# Patient Record
Sex: Male | Born: 1950 | Race: White | Hispanic: No | Marital: Single | Smoking: Former smoker
Health system: Southern US, Community
[De-identification: ages and names within clinical notes are randomized; demographics above are authoritative.]

---

## 2004-03-18 ENCOUNTER — Emergency Department: Payer: Self-pay | Admitting: Emergency Medicine

## 2005-01-24 ENCOUNTER — Emergency Department: Payer: Self-pay | Admitting: Emergency Medicine

## 2006-09-02 ENCOUNTER — Emergency Department: Payer: Self-pay | Admitting: Unknown Physician Specialty

## 2006-09-12 ENCOUNTER — Other Ambulatory Visit: Payer: Self-pay

## 2006-09-12 ENCOUNTER — Inpatient Hospital Stay: Payer: Self-pay | Admitting: Internal Medicine

## 2006-10-14 ENCOUNTER — Other Ambulatory Visit: Payer: Self-pay

## 2006-10-14 ENCOUNTER — Inpatient Hospital Stay: Payer: Self-pay | Admitting: Internal Medicine

## 2007-03-04 ENCOUNTER — Ambulatory Visit: Payer: Self-pay | Admitting: Internal Medicine

## 2007-05-28 ENCOUNTER — Emergency Department: Payer: Self-pay | Admitting: Emergency Medicine

## 2007-08-02 ENCOUNTER — Emergency Department: Payer: Self-pay | Admitting: Emergency Medicine

## 2007-08-09 ENCOUNTER — Emergency Department: Payer: Self-pay | Admitting: Emergency Medicine

## 2007-08-14 ENCOUNTER — Emergency Department: Payer: Self-pay | Admitting: Emergency Medicine

## 2007-08-20 ENCOUNTER — Ambulatory Visit: Payer: Self-pay | Admitting: Gastroenterology

## 2007-09-22 ENCOUNTER — Emergency Department: Payer: Self-pay | Admitting: Emergency Medicine

## 2007-10-24 ENCOUNTER — Emergency Department: Payer: Self-pay | Admitting: Emergency Medicine

## 2007-11-04 ENCOUNTER — Emergency Department: Payer: Self-pay | Admitting: Emergency Medicine

## 2007-12-17 ENCOUNTER — Emergency Department: Payer: Self-pay | Admitting: Emergency Medicine

## 2007-12-17 ENCOUNTER — Other Ambulatory Visit: Payer: Self-pay

## 2008-01-31 ENCOUNTER — Emergency Department: Payer: Self-pay

## 2008-04-08 IMAGING — CT CT HEAD WITHOUT CONTRAST
1 series · 16 of 30 positions shown, 20 images · non-contrast
Comparison: none

REASON FOR EXAM: frequent falls RME 2
COMMENTS:

[Series 2: soft tissue · axial · 0.40mm/px · z∈[+450,+590]mm · 16 of 32 slices shown, 20 images]
[im 2/32  brain]
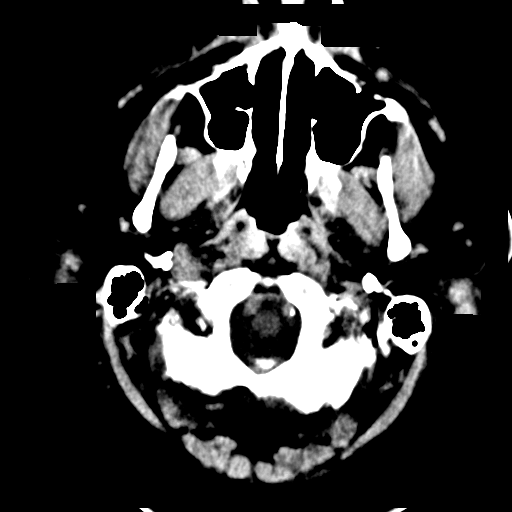
[im 2/32  bone]
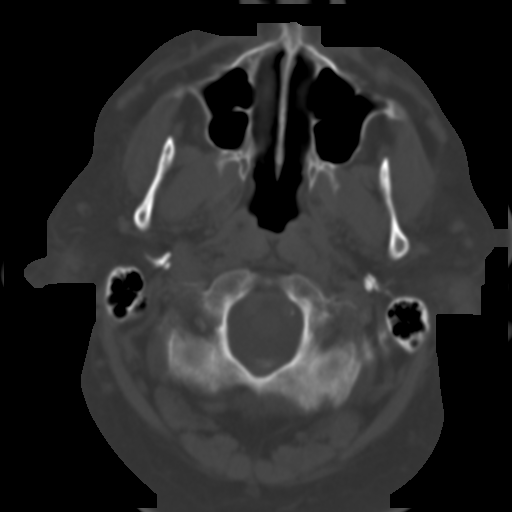
[im 4/32  brain]
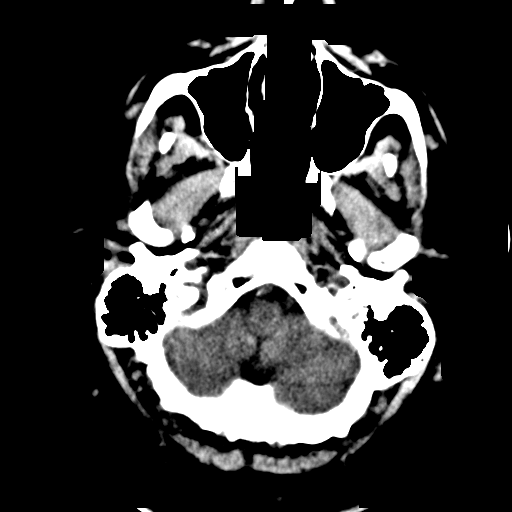
[im 6/32  brain]
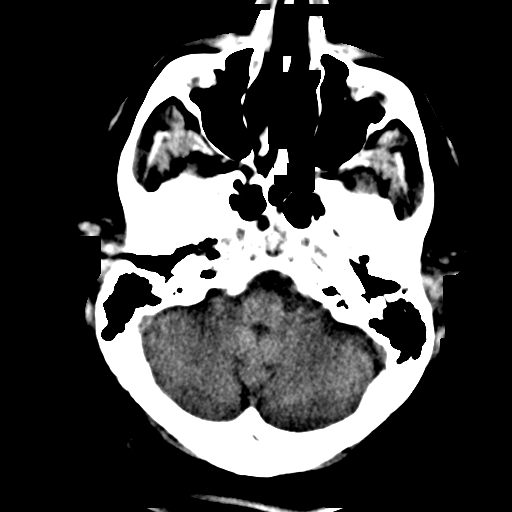
[im 8/32  brain]
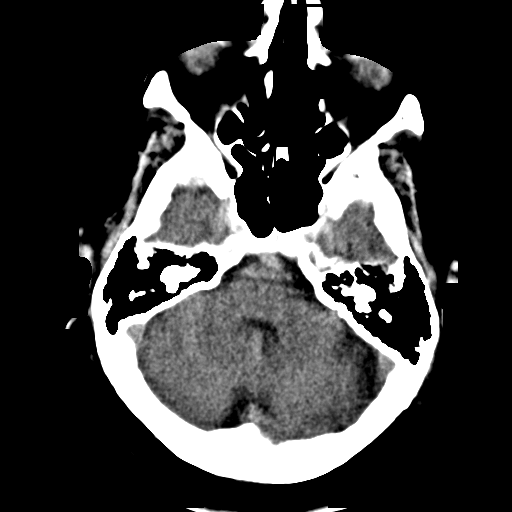
[im 9/32  brain]
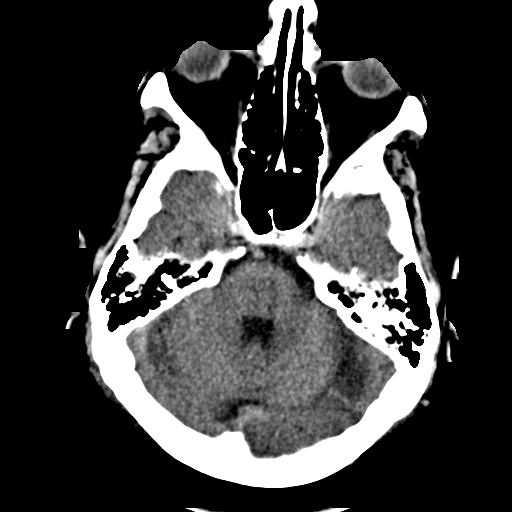
[im 9/32  bone]
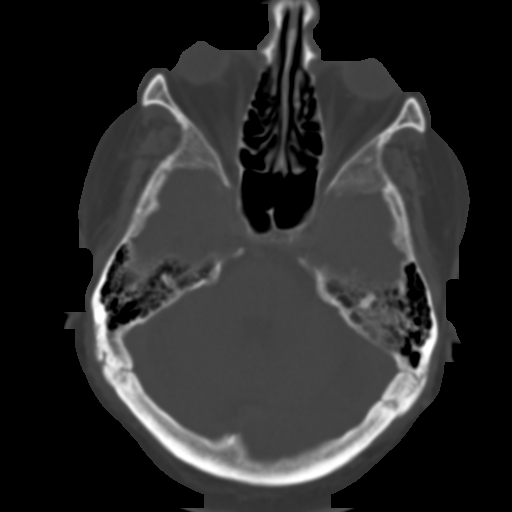
[im 11/32  brain]
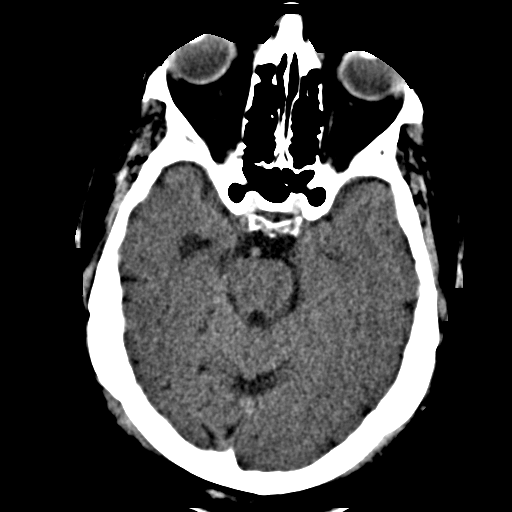
[im 13/32  brain]
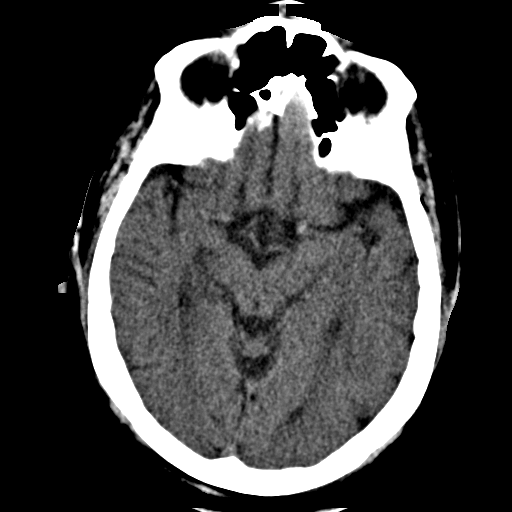
[im 15/32  brain]
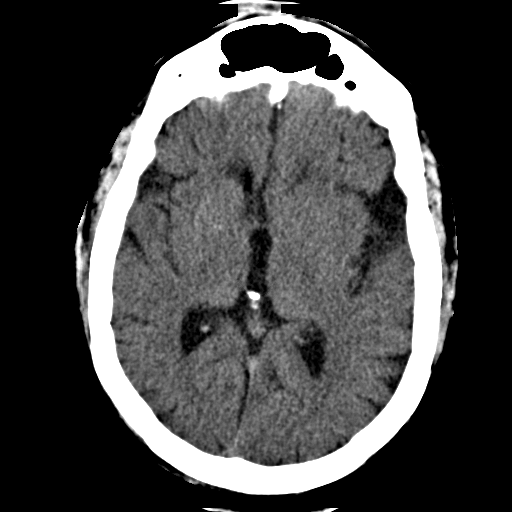
[im 17/32  brain]
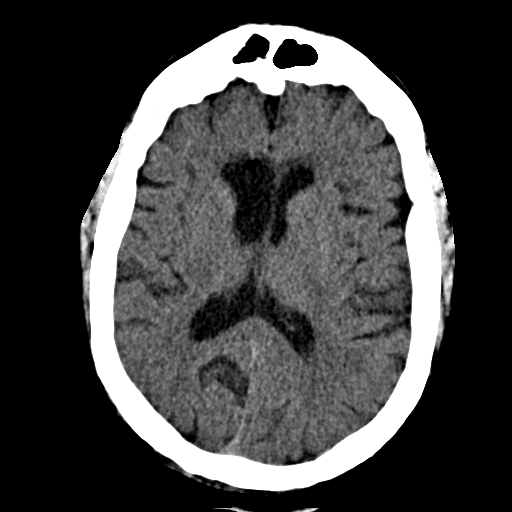
[im 17/32  bone]
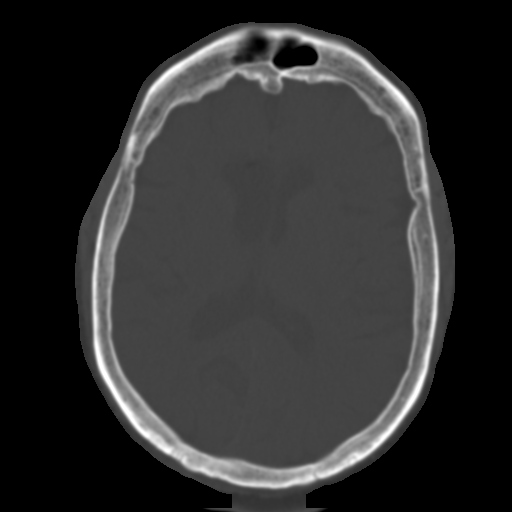
[im 19/32  brain]
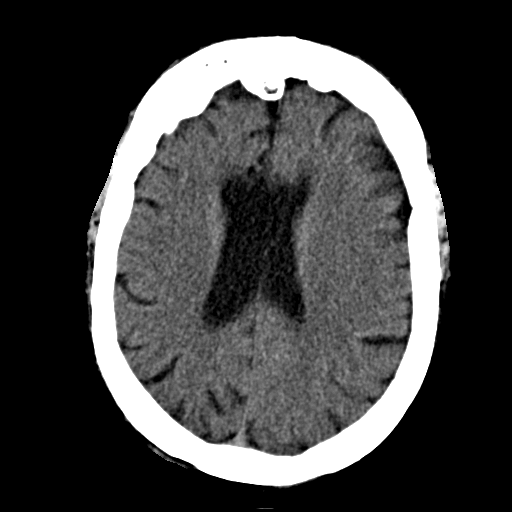
[im 21/32  brain]
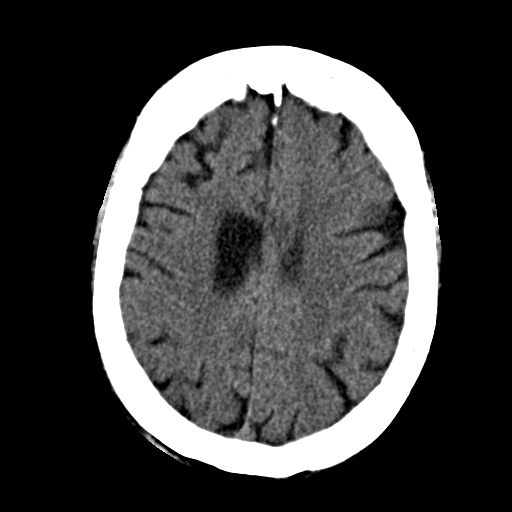
[im 23/32  brain]
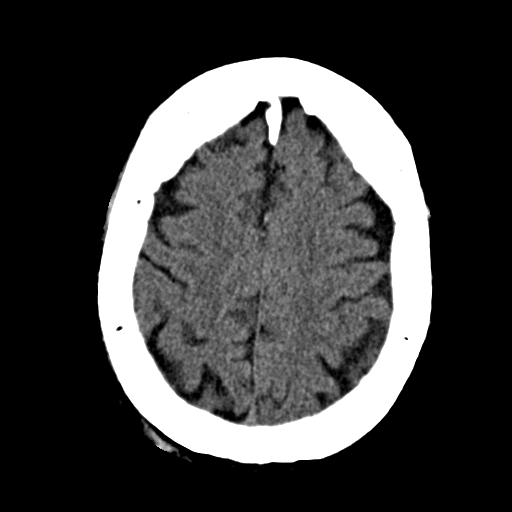
[im 24/32  brain]
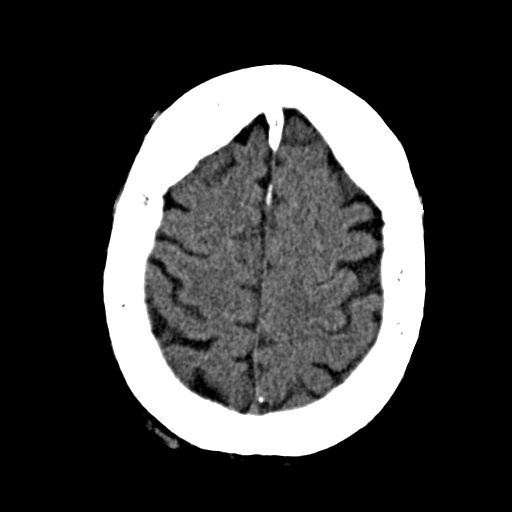
[im 24/32  bone]
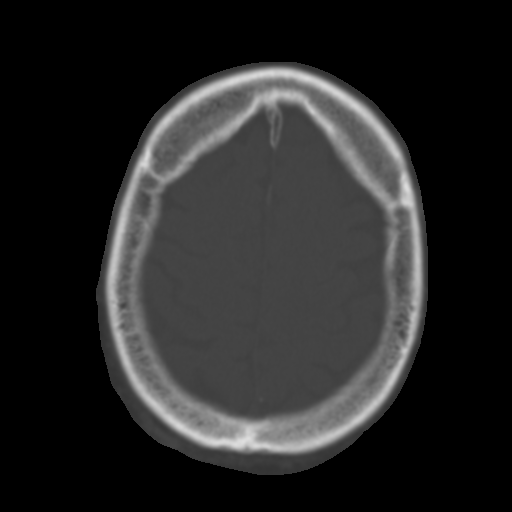
[im 26/32  brain]
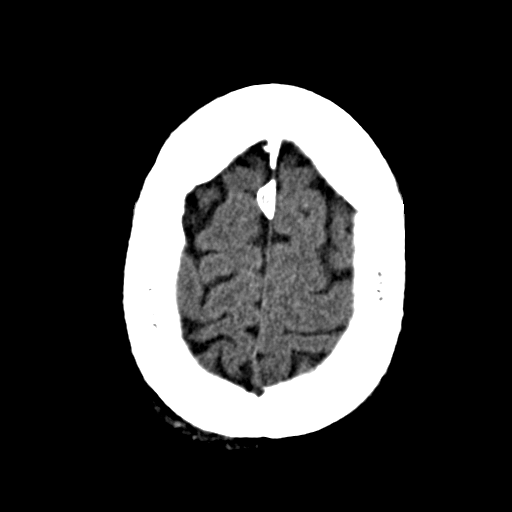
[im 28/32  brain]
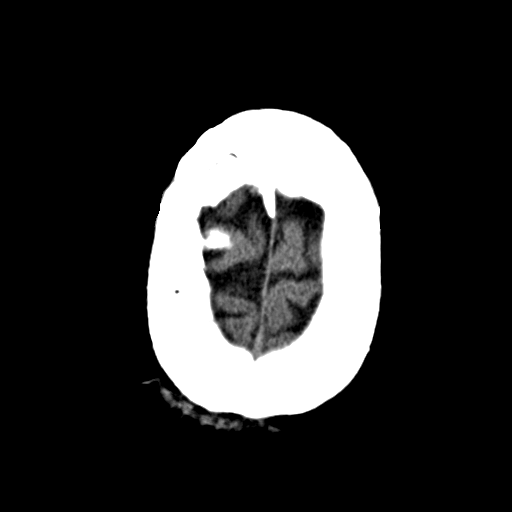
[im 30/32  brain]
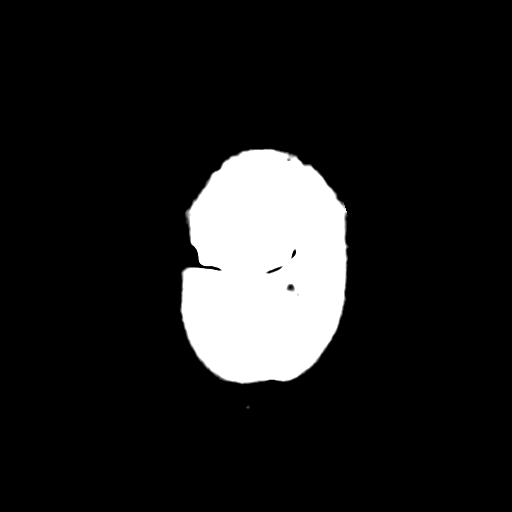

[16 of 30 positions shown; findings below may reference images not displayed]

PROCEDURE:     CT  - CT HEAD WITHOUT CONTRAST  - September 12, 2006  [DATE]

RESULT:       Emergency nonenhanced Head CT reveals no intraaxial or
extraaxial pathologic fluid or blood collections.  C[place]ka septum pellucidum
et vergae noted. This is a normal variant. The patient has had a prior RIGHT
frontal craniotomy.  No underlying abnormality is identified.
IMPRESSION: No acute abnormality.

This report was phoned to the Emergency Room physician at the time of the
study.

## 2008-04-08 IMAGING — CR DG CHEST 2V
1 series · 2 of 2 positions shown · non-contrast
Comparison: none

REASON FOR EXAM: fever
COMMENTS:

[Series 1: view not recorded · 0.17mm/px · 2 of 2 slices shown]
[im 1/2]
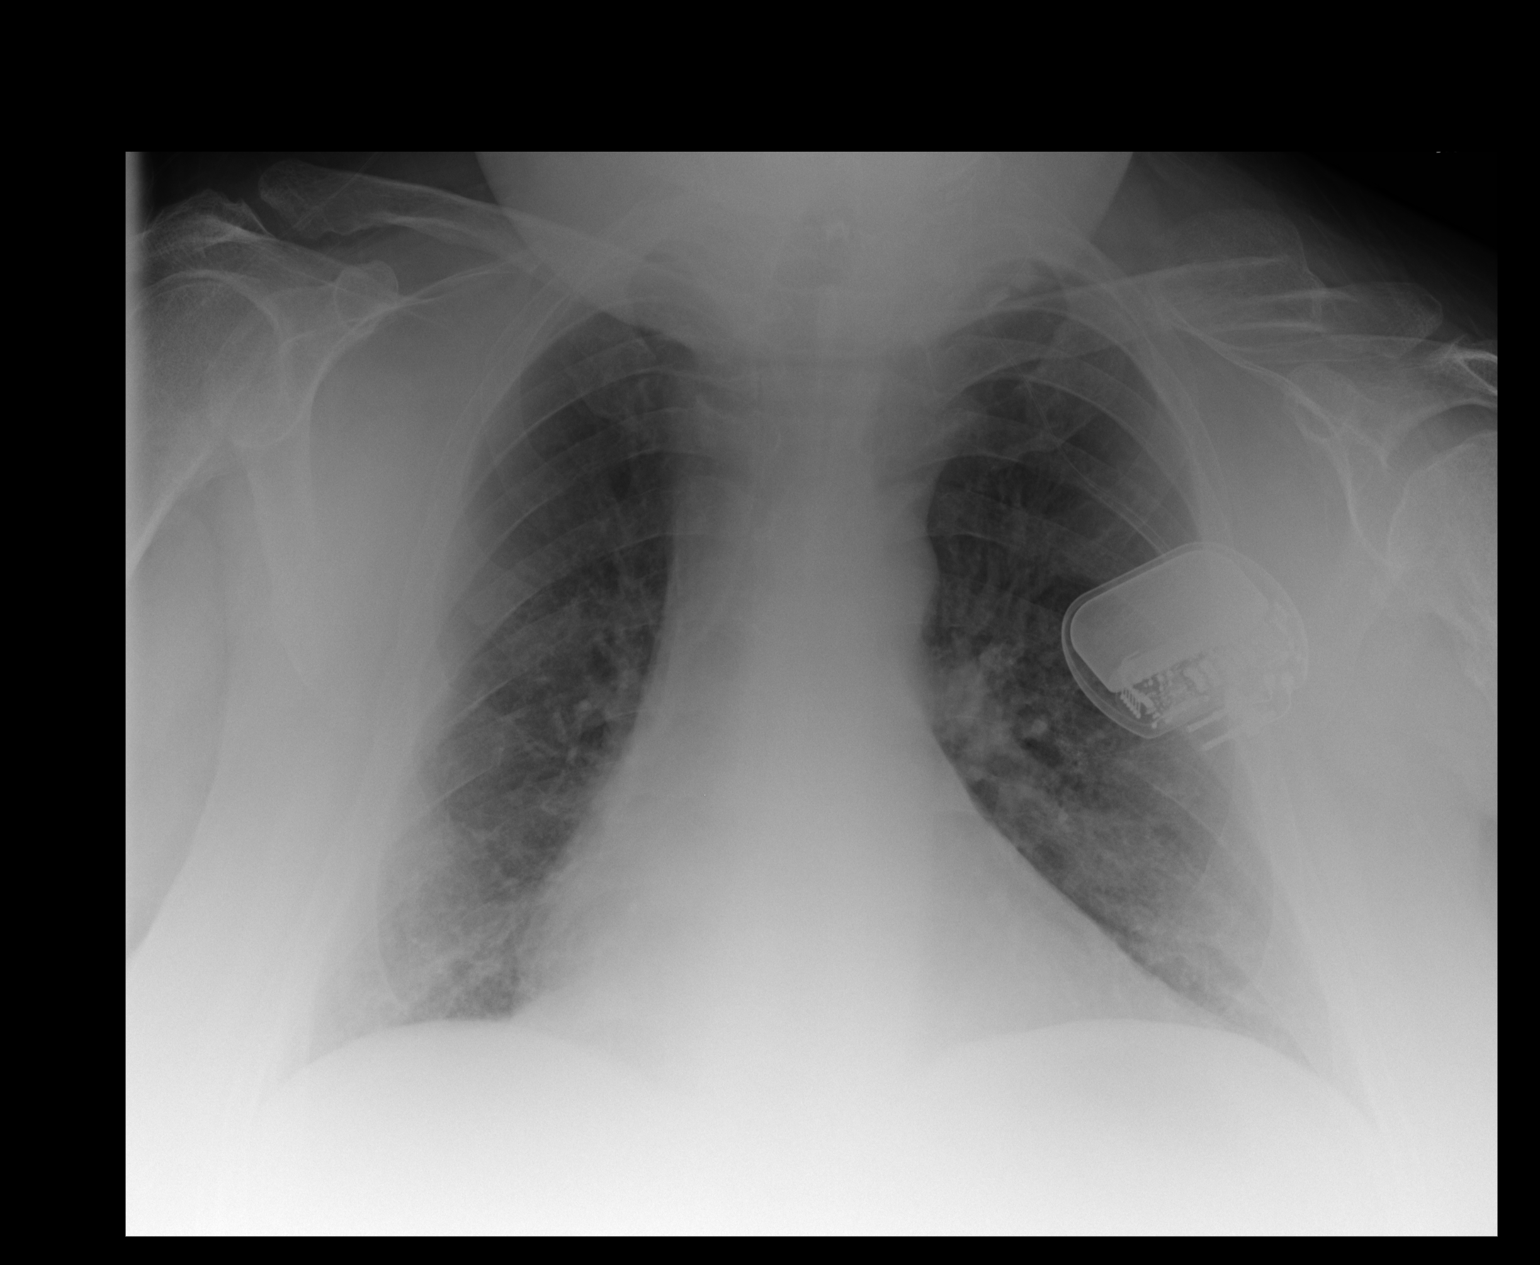
[im 2/2]
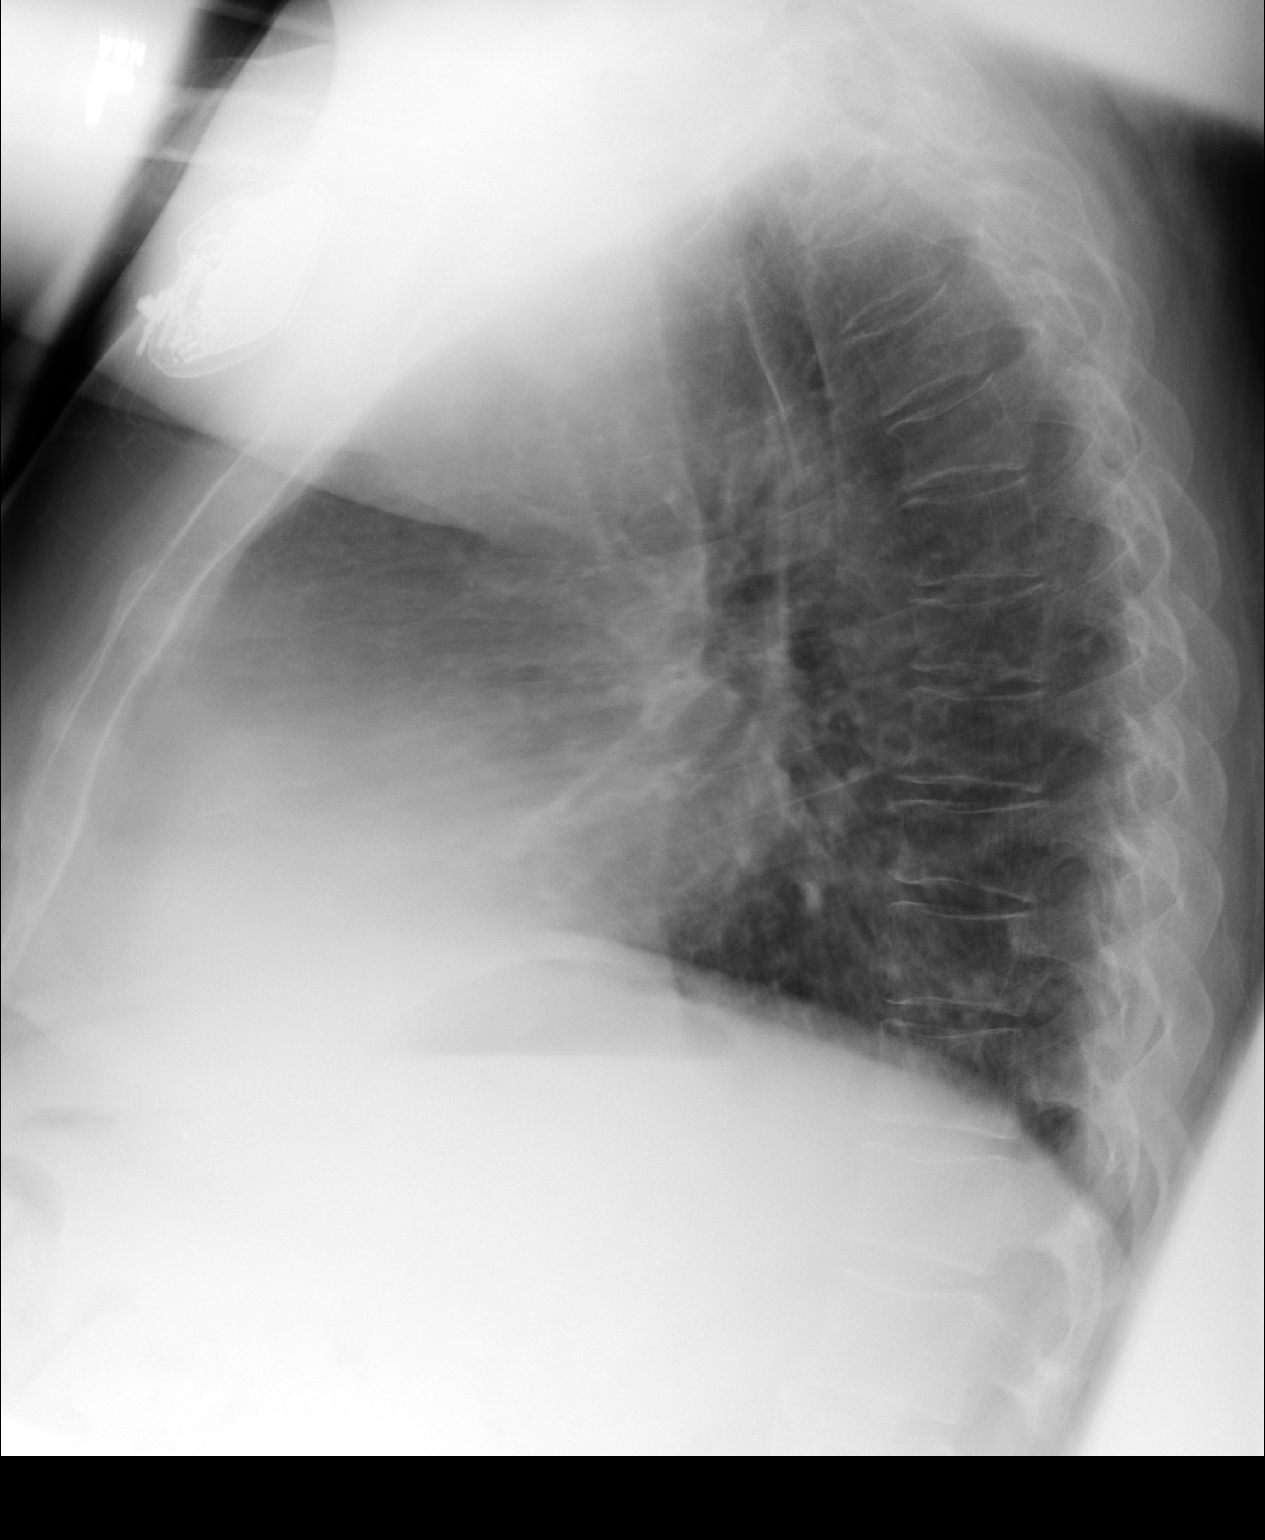

[2 of 2 positions shown; findings below may reference images not displayed]

PROCEDURE:     DXR - DXR CHEST PA (OR AP) AND LATERAL  - September 12, 2006  [DATE]

RESULT:     The patient has no prior study for comparison. A pacemaker
device is present on the left. The heart is at the upper limits of normal in
size. The lungs are hyperinflated but clear. There is no pulmonary edema,
infiltrate or effusion. There is no pneumothorax evident.
IMPRESSION: COPD. Borderline cardiomegaly. No acute abnormality.

## 2008-04-10 IMAGING — CR DG ABDOMEN 2V
1 series · 3 of 3 positions shown · non-contrast
Comparison: none

REASON FOR EXAM: vomiting
COMMENTS:

[Series 1: view not recorded · 0.17mm/px · 3 of 3 slices shown]
[im 1/3]
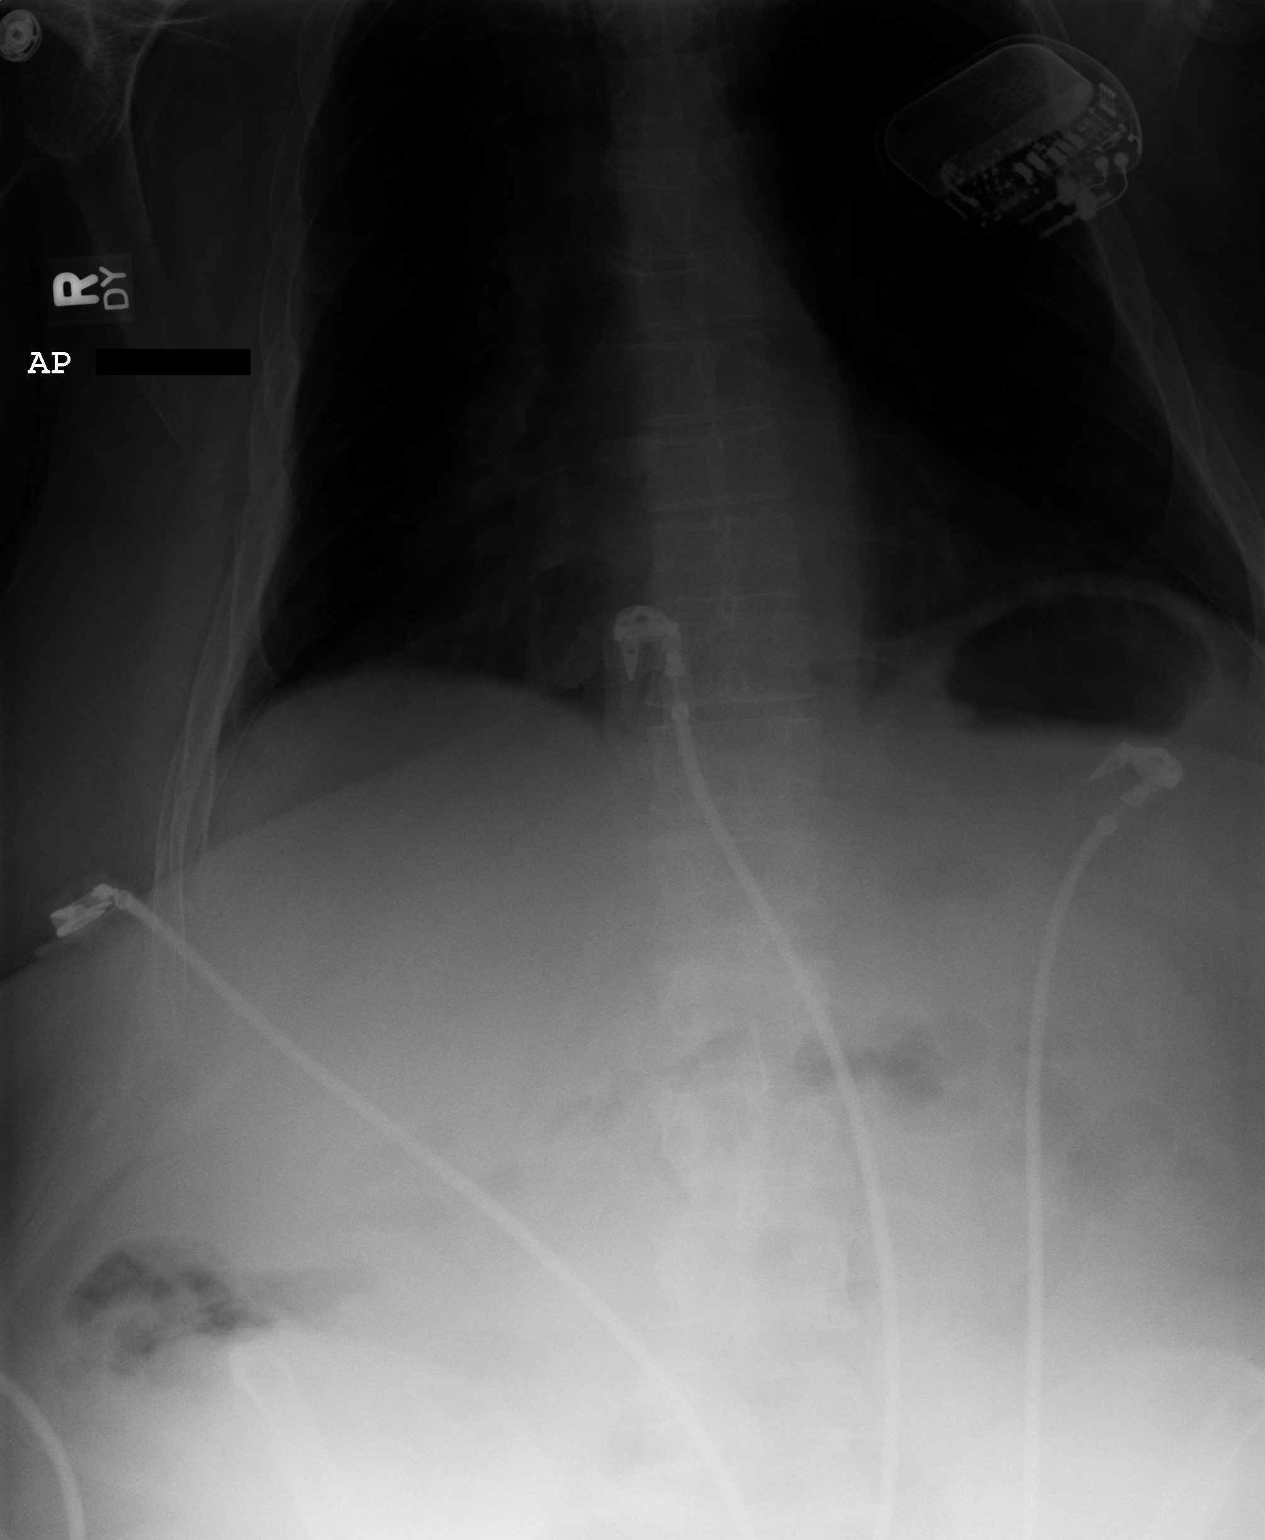
[im 2/3]
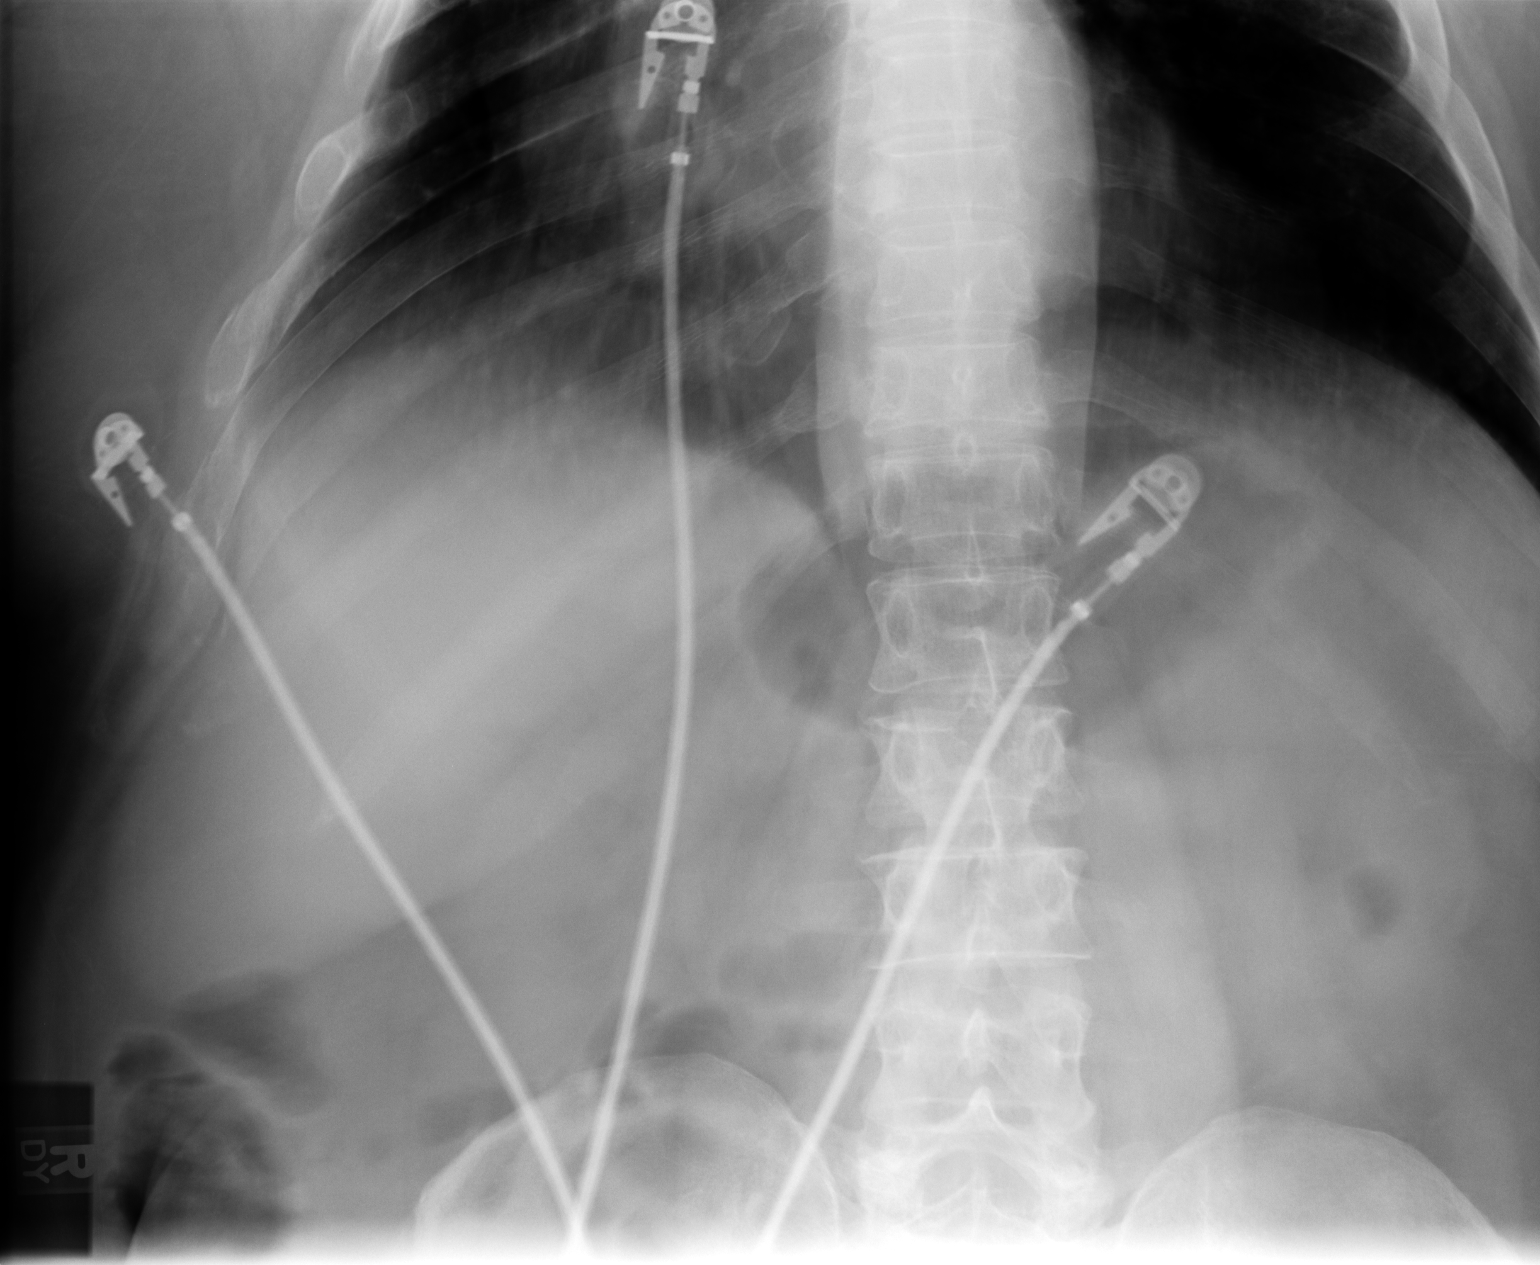
[im 3/3]
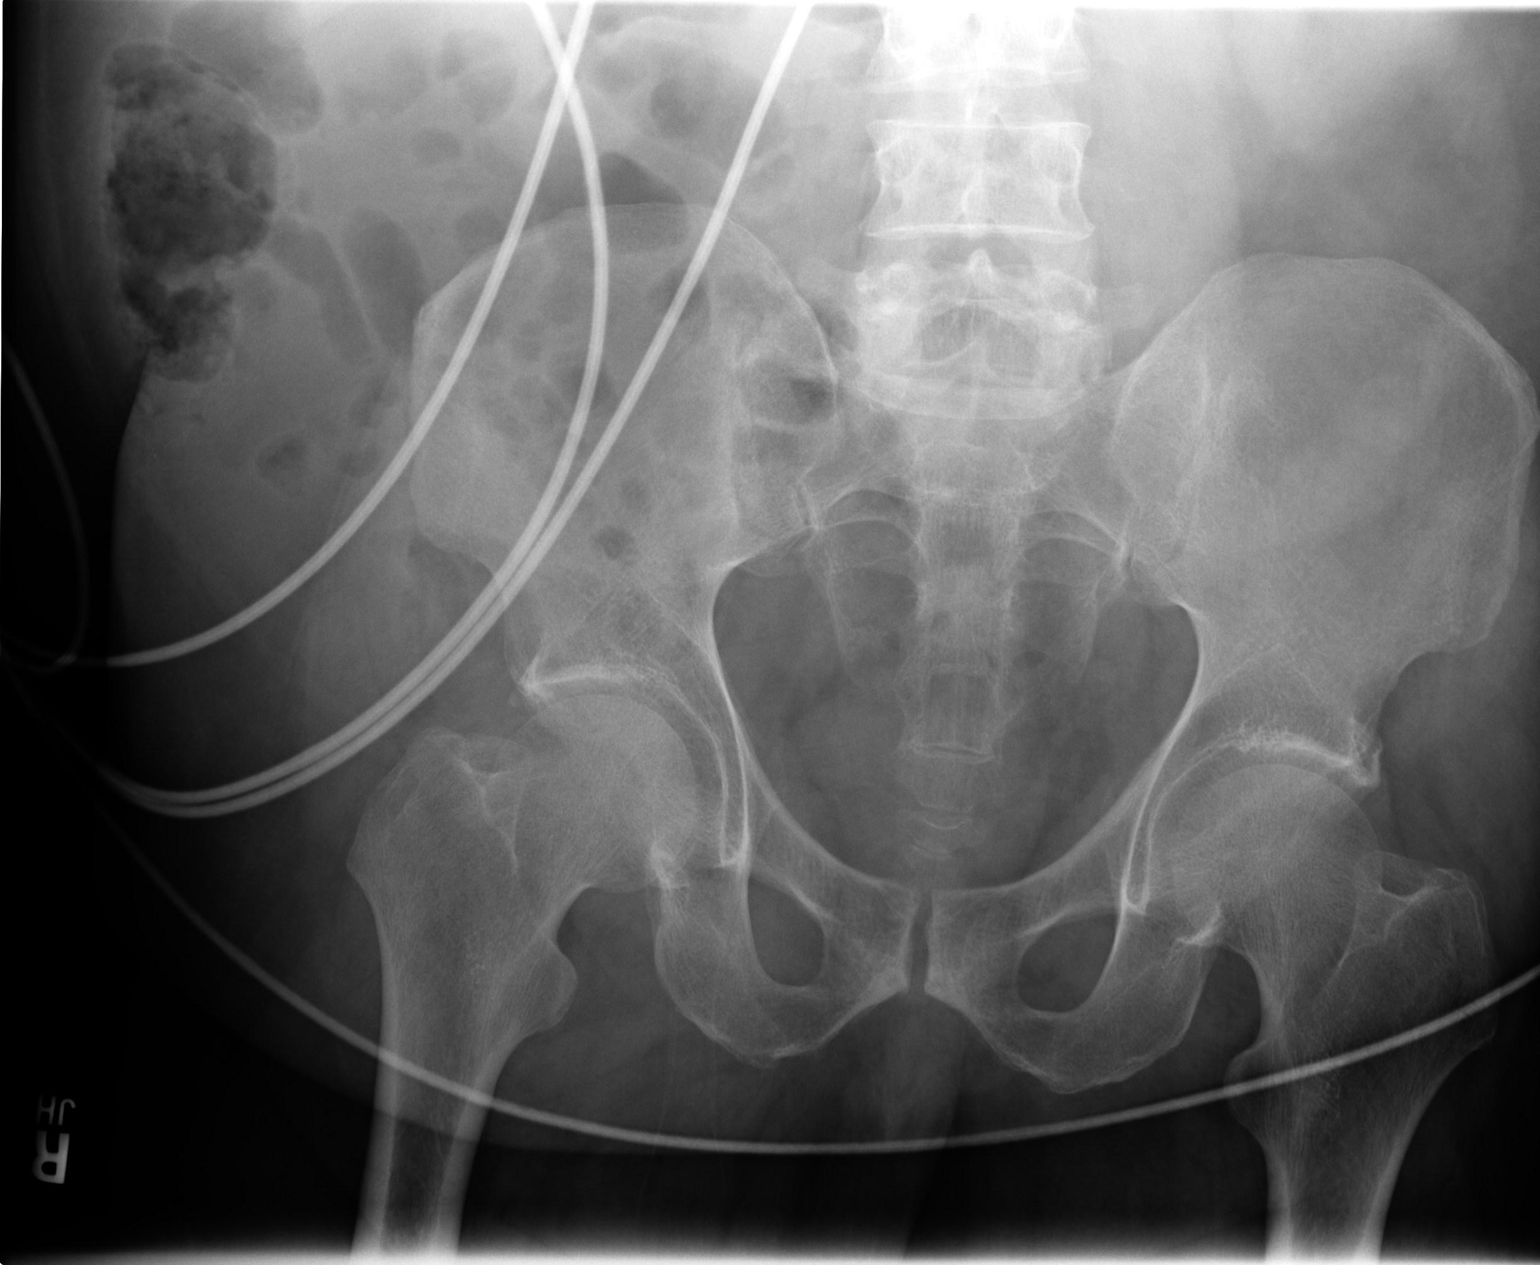

[3 of 3 positions shown; findings below may reference images not displayed]

PROCEDURE:     DXR - DXR ABDOMEN 2 V FLAT AND ERECT  - September 14, 2006 [DATE]

RESULT:     There are no prior images for comparison. Cardiac monitoring
electrodes are present. There is no abnormal bowel distention. Air is seen
within loops of small bowel.There is air in portions of the colon along with
a small amount of stool. The bony structures appear intact. No free air is
demonstrated.
IMPRESSION: No evidence of bowel obstruction or perforation.

## 2008-05-09 ENCOUNTER — Emergency Department: Payer: Self-pay | Admitting: Emergency Medicine

## 2010-09-22 ENCOUNTER — Ambulatory Visit: Payer: Self-pay | Admitting: Geriatric Medicine

## 2012-01-06 ENCOUNTER — Other Ambulatory Visit: Payer: Self-pay | Admitting: Geriatric Medicine

## 2012-01-06 LAB — CBC WITH DIFFERENTIAL/PLATELET
Basophil %: 0.7 %
Eosinophil %: 1.3 %
HCT: 39 % — ABNORMAL LOW (ref 40.0–52.0)
HGB: 13.6 g/dL (ref 13.0–18.0)
Lymphocyte #: 1 10*3/uL (ref 1.0–3.6)
MCH: 32.8 pg (ref 26.0–34.0)
MCV: 94 fL (ref 80–100)
Monocyte #: 0.8 x10 3/mm (ref 0.2–1.0)
Neutrophil #: 6.3 10*3/uL (ref 1.4–6.5)
Neutrophil %: 76.3 %
Platelet: 196 10*3/uL (ref 150–440)
RBC: 4.15 10*6/uL — ABNORMAL LOW (ref 4.40–5.90)

## 2012-01-11 ENCOUNTER — Inpatient Hospital Stay: Payer: Self-pay | Admitting: Internal Medicine

## 2012-01-11 LAB — COMPREHENSIVE METABOLIC PANEL
Albumin: 3.2 g/dL — ABNORMAL LOW (ref 3.4–5.0)
Anion Gap: 7 (ref 7–16)
BUN: 18 mg/dL (ref 7–18)
Bilirubin,Total: 0.4 mg/dL (ref 0.2–1.0)
Calcium, Total: 9.2 mg/dL (ref 8.5–10.1)
Chloride: 108 mmol/L — ABNORMAL HIGH (ref 98–107)
Co2: 28 mmol/L (ref 21–32)
Creatinine: 1.07 mg/dL (ref 0.60–1.30)
EGFR (Non-African Amer.): >60
Glucose: 139 mg/dL — ABNORMAL HIGH (ref 65–99)
Osmolality: 289 (ref 275–301)
Potassium: 4.4 mmol/L (ref 3.5–5.1)
Sodium: 143 mmol/L (ref 136–145)
Total Protein: 6.8 g/dL (ref 6.4–8.2)

## 2012-01-11 LAB — URINALYSIS, COMPLETE
Bilirubin,UR: NEGATIVE
Glucose,UR: 50 mg/dL (ref 0–75)
Leukocyte Esterase: NEGATIVE
Ph: 5 (ref 4.5–8.0)
RBC,UR: 20 /HPF (ref 0–5)
Squamous Epithelial: 1

## 2012-01-11 LAB — CBC
HCT: 37 % — ABNORMAL LOW (ref 40.0–52.0)
MCHC: 34.3 g/dL (ref 32.0–36.0)
MCV: 94 fL (ref 80–100)
Platelet: 253 10*3/uL (ref 150–440)
RDW: 14.1 % (ref 11.5–14.5)
WBC: 9.9 10*3/uL (ref 3.8–10.6)

## 2012-01-11 LAB — PROTIME-INR: INR: 0.9

## 2012-01-11 LAB — CK TOTAL AND CKMB (NOT AT ARMC)
CK, Total: 35 U/L (ref 35–232)
CK-MB: <0.5 ng/mL — ABNORMAL LOW (ref 0.5–3.6)

## 2012-01-12 LAB — BASIC METABOLIC PANEL
Anion Gap: 5 — ABNORMAL LOW (ref 7–16)
Calcium, Total: 8.3 mg/dL — ABNORMAL LOW (ref 8.5–10.1)
Co2: 26 mmol/L (ref 21–32)
Creatinine: 0.64 mg/dL (ref 0.60–1.30)
EGFR (African American): >60
EGFR (Non-African Amer.): >60

## 2012-01-12 LAB — CARBAMAZEPINE LEVEL, TOTAL: Carbamazepine: 4.7 ug/mL (ref 4.0–12.0)

## 2012-08-13 ENCOUNTER — Other Ambulatory Visit: Payer: Self-pay | Admitting: Family Medicine

## 2012-08-13 LAB — CBC WITH DIFFERENTIAL/PLATELET
Basophil #: 0.1 10*3/uL (ref 0.0–0.1)
Basophil %: 0.8 %
Eosinophil #: 0.1 10*3/uL (ref 0.0–0.7)
Eosinophil %: 0.9 %
HGB: 12.1 g/dL — ABNORMAL LOW (ref 13.0–18.0)
Lymphocyte #: 1 10*3/uL (ref 1.0–3.6)
Lymphocyte %: 12.9 %
MCHC: 34.4 g/dL (ref 32.0–36.0)
MCV: 92 fL (ref 80–100)
Monocyte #: 0.7 x10 3/mm (ref 0.2–1.0)
Neutrophil #: 5.8 10*3/uL (ref 1.4–6.5)
Neutrophil %: 76.7 %
Platelet: 261 10*3/uL (ref 150–440)
RDW: 13.9 % (ref 11.5–14.5)
WBC: 7.5 10*3/uL (ref 3.8–10.6)

## 2012-08-13 LAB — BASIC METABOLIC PANEL
BUN: 15 mg/dL (ref 7–18)
Calcium, Total: 9.6 mg/dL (ref 8.5–10.1)
Chloride: 101 mmol/L (ref 98–107)
Co2: 29 mmol/L (ref 21–32)
Creatinine: 0.62 mg/dL (ref 0.60–1.30)
EGFR (African American): >60
EGFR (Non-African Amer.): >60
Osmolality: 275 (ref 275–301)
Potassium: 4.5 mmol/L (ref 3.5–5.1)
Sodium: 137 mmol/L (ref 136–145)

## 2012-08-13 LAB — URINALYSIS, COMPLETE
Bacteria: NONE SEEN
Bilirubin,UR: NEGATIVE
Hyaline Cast: 14
Ketone: NEGATIVE
Leukocyte Esterase: NEGATIVE
Nitrite: NEGATIVE
Ph: 5 (ref 4.5–8.0)
RBC,UR: NONE SEEN /HPF (ref 0–5)
WBC UR: <1 /HPF (ref 0–5)

## 2012-08-13 LAB — HEPATIC FUNCTION PANEL A (ARMC)
Albumin: 3.4 g/dL (ref 3.4–5.0)
Bilirubin, Direct: <0.1 mg/dL (ref 0.00–0.20)
Bilirubin,Total: 0.3 mg/dL (ref 0.2–1.0)
SGOT(AST): 28 U/L (ref 15–37)
SGPT (ALT): 57 U/L (ref 12–78)

## 2012-08-15 LAB — URINE CULTURE

## 2013-12-29 ENCOUNTER — Other Ambulatory Visit: Payer: Self-pay | Admitting: Family Medicine

## 2013-12-29 LAB — CARBAMAZEPINE LEVEL, TOTAL: Carbamazepine: 1.6 ug/mL — ABNORMAL LOW (ref 4.0–12.0)

## 2014-02-07 ENCOUNTER — Ambulatory Visit: Payer: Self-pay

## 2014-02-07 LAB — URINALYSIS, COMPLETE
Bacteria: NONE SEEN
Bilirubin,UR: NEGATIVE
Glucose,UR: NEGATIVE mg/dL (ref 0–75)
Ketone: NEGATIVE
Nitrite: NEGATIVE
PH: 6 (ref 4.5–8.0)
PROTEIN: NEGATIVE
RBC,UR: 624 /HPF (ref 0–5)
SPECIFIC GRAVITY: 1.023 (ref 1.003–1.030)
SQUAMOUS EPITHELIAL: NONE SEEN
WBC UR: 13 /HPF (ref 0–5)

## 2014-02-07 LAB — BASIC METABOLIC PANEL
Anion Gap: 5 — ABNORMAL LOW (ref 7–16)
BUN: 18 mg/dL (ref 7–18)
CALCIUM: 9.4 mg/dL (ref 8.5–10.1)
CHLORIDE: 105 mmol/L (ref 98–107)
Co2: 30 mmol/L (ref 21–32)
Creatinine: 0.8 mg/dL (ref 0.60–1.30)
EGFR (African American): >60
EGFR (Non-African Amer.): >60
Glucose: 108 mg/dL — ABNORMAL HIGH (ref 65–99)
Osmolality: 282 (ref 275–301)
Potassium: 4 mmol/L (ref 3.5–5.1)
SODIUM: 140 mmol/L (ref 136–145)

## 2014-02-07 LAB — CBC WITH DIFFERENTIAL/PLATELET
BASOS ABS: 0.1 10*3/uL (ref 0.0–0.1)
BASOS PCT: 0.6 %
EOS ABS: 0 10*3/uL (ref 0.0–0.7)
EOS PCT: 0.3 %
HCT: 39.8 % — ABNORMAL LOW (ref 40.0–52.0)
HGB: 13.1 g/dL (ref 13.0–18.0)
LYMPHS PCT: 8.7 %
Lymphocyte #: 1 10*3/uL (ref 1.0–3.6)
MCH: 31.4 pg (ref 26.0–34.0)
MCHC: 32.9 g/dL (ref 32.0–36.0)
MCV: 95 fL (ref 80–100)
MONO ABS: 1.5 x10 3/mm — AB (ref 0.2–1.0)
MONOS PCT: 13.1 %
Neutrophil #: 8.7 10*3/uL — ABNORMAL HIGH (ref 1.4–6.5)
Neutrophil %: 77.3 %
PLATELETS: 358 10*3/uL (ref 150–440)
RBC: 4.17 10*6/uL — ABNORMAL LOW (ref 4.40–5.90)
RDW: 13.9 % (ref 11.5–14.5)
WBC: 11.2 10*3/uL — ABNORMAL HIGH (ref 3.8–10.6)

## 2014-02-07 LAB — TSH: Thyroid Stimulating Horm: 0.393 u[IU]/mL — ABNORMAL LOW

## 2014-02-07 LAB — T4, FREE: FREE THYROXINE: 0.74 ng/dL — AB (ref 0.76–1.46)

## 2014-02-07 LAB — CARBAMAZEPINE LEVEL, TOTAL: CARBAMAZEPINE: 6.7 ug/mL (ref 4.0–12.0)

## 2014-02-10 LAB — URINE CULTURE

## 2014-03-17 ENCOUNTER — Ambulatory Visit: Payer: Self-pay

## 2014-03-17 LAB — COMPREHENSIVE METABOLIC PANEL
ALK PHOS: 79 U/L
AST: 24 U/L (ref 15–37)
Albumin: 3 g/dL — ABNORMAL LOW (ref 3.4–5.0)
Anion Gap: 11 (ref 7–16)
BUN: 96 mg/dL — ABNORMAL HIGH (ref 7–18)
Bilirubin,Total: 0.1 mg/dL — ABNORMAL LOW (ref 0.2–1.0)
CHLORIDE: 110 mmol/L — AB (ref 98–107)
Calcium, Total: 8.4 mg/dL — ABNORMAL LOW (ref 8.5–10.1)
Co2: 25 mmol/L (ref 21–32)
Creatinine: 2.69 mg/dL — ABNORMAL HIGH (ref 0.60–1.30)
GFR CALC AF AMER: 31 — AB
GFR CALC NON AF AMER: 26 — AB
GLUCOSE: 113 mg/dL — AB (ref 65–99)
Osmolality: 321 (ref 275–301)
POTASSIUM: 3.8 mmol/L (ref 3.5–5.1)
SGPT (ALT): 62 U/L
SODIUM: 146 mmol/L — AB (ref 136–145)
TOTAL PROTEIN: 6.6 g/dL (ref 6.4–8.2)

## 2014-03-17 LAB — CBC WITH DIFFERENTIAL/PLATELET
BASOS ABS: 0 10*3/uL (ref 0.0–0.1)
Basophil %: 0.5 %
EOS PCT: 0.9 %
Eosinophil #: 0.1 10*3/uL (ref 0.0–0.7)
HCT: 34.6 % — ABNORMAL LOW (ref 40.0–52.0)
HGB: 11.5 g/dL — AB (ref 13.0–18.0)
LYMPHS ABS: 1.5 10*3/uL (ref 1.0–3.6)
LYMPHS PCT: 14.9 %
MCH: 31.6 pg (ref 26.0–34.0)
MCHC: 33.1 g/dL (ref 32.0–36.0)
MCV: 96 fL (ref 80–100)
Monocyte #: 1.2 x10 3/mm — ABNORMAL HIGH (ref 0.2–1.0)
Monocyte %: 11.6 %
NEUTROS ABS: 7.4 10*3/uL — AB (ref 1.4–6.5)
NEUTROS PCT: 72.1 %
PLATELETS: 264 10*3/uL (ref 150–440)
RBC: 3.62 10*6/uL — ABNORMAL LOW (ref 4.40–5.90)
RDW: 13.6 % (ref 11.5–14.5)
WBC: 10.2 10*3/uL (ref 3.8–10.6)

## 2014-03-18 ENCOUNTER — Ambulatory Visit: Payer: Self-pay

## 2014-03-18 LAB — URINALYSIS, COMPLETE
Bacteria: NONE SEEN
Bilirubin,UR: NEGATIVE
Blood: NEGATIVE
Glucose,UR: NEGATIVE mg/dL (ref 0–75)
Ketone: NEGATIVE
LEUKOCYTE ESTERASE: NEGATIVE
Nitrite: NEGATIVE
PH: 5 (ref 4.5–8.0)
PROTEIN: NEGATIVE
RBC,UR: 1 /HPF (ref 0–5)
SPECIFIC GRAVITY: 1.015 (ref 1.003–1.030)
Squamous Epithelial: NONE SEEN

## 2014-03-18 LAB — BUN: BUN: 78 mg/dL — AB (ref 7–18)

## 2014-03-18 LAB — CREATININE, SERUM
Creatinine: 1.63 mg/dL — ABNORMAL HIGH (ref 0.60–1.30)
EGFR (Non-African Amer.): 46 — ABNORMAL LOW
GFR CALC AF AMER: 56 — AB

## 2014-03-20 LAB — URINE CULTURE

## 2014-08-02 NOTE — H&P (Signed)
PATIENT NAME:  Benjamin Williams, Benjamin Williams MR#:  [id number] DATE OF BIRTH:  Nov 10, 1950  DATE OF ADMISSION:  01/11/2012  PRIMARY CARE PHYSICIAN: Dr. Hall Busing   REFERRING PHYSICIAN: Dr. Benjaman Lobe   CHIEF COMPLAINT: Syncope and confusion today.   HISTORY OF PRESENT ILLNESS: The patient is a 64 year old Caucasian male with a history of seizure, hypertension, dysphagia, and glaucoma who was brought to the ED from nursing home due to syncope and confusion today. The patient is unresponsive to verbal stimuli but in no acute distress. According to Dr. Benjaman Lobe, the patient was noted to have one episode of syncope at 2:30 p.m. in the nursing home and was noted to be confused and sent to the ED for further evaluation. The patient was noted to have a low blood sugar in the 60's, was treated with D50 and blood sugar increased to 160 but dropped to the 60's again. The patient was treated with D50 three times. In addition, the patient's blood pressure was in the 70's and was treated with normal saline bolus. Blood pressure increased to 80's. The patient was also treated with two doses of Narcan and became more responsive after treatment.   PAST MEDICAL HISTORY: As mentioned above:  1. Seizure disorder. 2. Hypertension. 3. Dysphasia. 4. Glaucoma.   SOCIAL HISTORY: Nursing home resident. Unable to obtain any other information.    FAMILY HISTORY: Unknown.   PAST SURGICAL HISTORY: Nerve stimulator.  REVIEW OF SYSTEMS: Unable to obtain due to unresponsiveness and altered mental status.  ALLERGIES: Allergic to aminophylline, Ritalin, and sulfa drugs.   MEDICATIONS:  1. Xalatan solution 0.05% drop to each eye at bedtime.  2. Sebulex topical shampoo apply topically twice a day. 3. Protonix 40 mg 1 tablet p.o. daily. 4. Lisinopril 20 mg p.o. daily.  5. Klor-Con 10 one tablet p.o. daily. 6. Felbamate 600 mg 3 tablets p.o. b.i.d.  7. Effexor XR capsule one cap once daily.  8. Ditropan 5 mg p.o. 2 tablets b.i.d.   9. Diazepam 5 mg 2 tablets p.o. daily at bedtime.  10. Diastat AcuDial Kit rectal once.  11. Clarinex tablet 5 mg p.o. daily.   PHYSICAL EXAMINATION:   VITALS: Temperature 97.9, blood pressure 120/60, pulse 59, oxygen saturation 100% on room air.   GENERAL: The patient is responsive to verbal stimuli and pain stimuli but in no acute distress.   HEENT: Pupils are round, equal, reactive to light. No discharge from ear or nose. Unable to examine oral mucosa or oropharynx.   NECK: Supple. No JVD or carotid bruit. No lymphadenopathy. No thyromegaly.   CARDIOVASCULAR: S1, S2 regular rate and rhythm. No murmurs or gallops.   PULMONARY: Bilateral air entry. No wheezing or rales.   ABDOMEN: Soft, obese. No distention. No organomegaly. Bowel sounds present.   EXTREMITIES: No edema, clubbing, or cyanosis. Strong bilateral pedal pulses.   SKIN: No rash or jaundice.    NEUROLOGIC: The patient is unresponsive, unable to examine at this time.   LABORATORY, DIAGNOSTIC, AND RADIOLOGICAL DATA: Glucose 69 and 161.  CAT scan of head no acute intracranial process.   Urinalysis RBC 20, WBC 1, nitrates negative. CBC showed WBC 9.9, hemoglobin 12.7, platelets 253, glucose 139, BUN 18, creatinine 1.07. Electrolytes are normal. CK 35. CK-MB less than 0.5. INR 0.9. Troponin less than 0.02.   EKG showed normal sinus rhythm at 66 beats per minute.   Chest x-ray there is no previous infiltrate, please refer to the radiology report.   IMPRESSION:  1. Syncope and altered  mental status possibly due to hypotension and hypoglycemia.  2. Hypotension, unknown etiology, possible medication related.  3. Hypoglycemia.  4. Seizure disorder.  5. Dysphagia. 6. History of hypertension.   PLAN OF TREATMENT:  1. The patient will be admitted to the tele floor.  2. We will hold lisinopril, Cardizem and give D5 normal saline IV at 150 mL/h and follow-up BMP.  3. Will keep n.p.o.  4. Will get a swallowing  study. 5. Seizure, fall, and aspiration precautions.  6. We will get Accu-Chek. 7. Hypoglycemia protocol.  8. GI and DVT prophylaxis.   CODE STATUS: DO NOT RESUSCITATE.   TIME SPENT: About 55 minutes.   ____________________________ Demetrios Loll, MD qc:drc D: 01/11/2012 19:10:19 ET T: 01/12/2012 08:18:32 ET JOB#: [id number]  cc: Demetrios Loll, MD, <Dictator>, Leona Carry. Hall Busing, MD Demetrios Loll MD ELECTRONICALLY SIGNED 01/12/2012 21:45

## 2014-08-02 NOTE — Discharge Summary (Signed)
PATIENT NAME:  Benjamin Williams, HOLLENKAMP MR#:  [id number] DATE OF BIRTH:  May 11, 1950  DATE OF ADMISSION:  01/11/2012 DATE OF DISCHARGE:  01/15/2012  PRIMARY CARE PHYSICIAN: Dr. Arlana Pouch   FINAL DIAGNOSES: 1. Altered mental status due to seizure.  2. Hypertension.  3. Depression.  4. Hypotension. 5. Hypoglycemia.   CODE STATUS: DO NOT RESUSCITATE.   CONDITION: Stable.   HOME MEDICATIONS:  1. Lasix 20 mg p.o. daily. 2. Potassium chloride 10 mEq tablets 2 tablets once daily with food and snack for after meal with at least 4 oz of liquid for hypokalemia.  3. Norvasc 10 mg p.o. daily.  4. Venlafaxine 75 mg p.o. daily.  5. Ergocalciferol 50,0000 international units 1.25 mg p.o. capsule one cap once a month.  6. Senokot 2 tablets once daily at bedtime.  7. Lacosamide 100 mg p.o. tablets 2 tablets once daily at bedtime for seizure.  8. Latanoprost 0.005% ophthalmic solution one dropped into each eye once a day at bedtime.  9. Felbamate 600 mg p.o. 3 tablets once daily at bedtime.  10. Lisinopril 20 mg p.o. b.i.d.  11. Carbamazepine 200 mg capsule 3 capsules b.i.d.  12. Omega 3 1000 mg p.o. capsule b.i.d.  13. Lacosamide 100 mg p.o. in the morning.  14. Felbamate 600 mg p.o. 2 tablets in the morning   DIET: Low sodium diet.   ACTIVITY: As tolerated.   FOLLOW-UP CARE: Follow up with Dr. Malvin Johns within 2 to 4 weeks.   HOSPITAL COURSE: Patient is a 64 year old Caucasian male with history of seizure disorder, hypertension, dysphasia, glaucoma was brought to ED from nursing home due to syncope and confusion episode. Initially in the ED patient was unresponsive but in no acute distress. Patient had low sugars at 60s, was treated with D50 and also patient was noted to have low blood pressure at 70s, treated with normal saline and blood pressure improved. For detailed history and physical examination, please refer to the admission note dictated by Dr. Imogene Burn.  1. Patient was admitted for syncope and altered  mental status possibly due to hypoglycemia and hypotension. At admission patient's hypertension medication was held due to hypotension and the patient was given IV fluid support. In addition, patient was placed on seizure, fall and aspiration precautions and hypoglycemia protocol. After admission. patient developed seizure. Patient was treated with IV Dilantin and then changed to home medications including Tegretol, Vimpat and felbamate. Actually, according to skilled nursing facility, patient does have 2 to 3 episodes of seizure daily despite being on multiple antiepileptics. He is following up with [facility] for seizure disorder. Patient's EEG was abnormal consistent with possible focal seizures.  2. Hypertension. Patient has hypotension so hypertension medication was on hold, however, patient's blood pressure went high so hypertension medication was resumed.  3. Patient is more alert, awake than before but noncommunicative in no acute distress. Patient is clinically stable, will be discharged back to nursing home today. Patient's mental status seems to be back at baseline.   ____________________________ Shaune Pollack, MD qc:cms D: 01/15/2012 10:44:12 ET T: 01/15/2012 11:15:26 ET JOB#: [id number]  cc: Shaune Pollack, MD, <Dictator> Jillene Bucks. Arlana Pouch, MD Shaune Pollack MD ELECTRONICALLY SIGNED 01/15/2012 15:20

## 2014-08-02 NOTE — Consult Note (Signed)
PATIENT NAME:  Benjamin Williams, Benjamin Williams MR#:  [id number] DATE OF BIRTH:  Feb 05, 1951  DATE OF CONSULTATION:  01/15/2012  REFERRING PHYSICIAN:  Hilda Lias, MD CONSULTING PHYSICIAN:  Benjamin Phi. 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 He was admitted 01/11/2012 for syncope and confusion, and is referred for evaluation of recurrent seizures. History comes from his hospital chart, conversation with caregiver at [facility], and from [facility] records obtained by fax.   The patient was brought to the Emergency Room on 01/11/2012 by EMTs summoned by [facility] staff with concern regarding possible cardiac event. At approximately 2:30 p.m., the patient was witnessed, while seated in a wheelchair in the hall, to be slumped forward where he was found to be unresponsive without respirations or pulse. He was subsequently moved to his bed. After a total of approximately two minutes he gasped and then continued spontaneous respirations. Oxygen was applied. His heart rate was between 40 and 120 and blood pressure 68  to 70/40s. Blood sugar was 140. It is reported that his eyes had remained open, but he was unresponsive awaiting EMT arrival.   In the Emergency Room, initial blood sugar was in  the 60s. He received an amp of D50 with blood sugar up to 160, and then dropping again, eventually receiving a total of 3 amps of D50. Initial systolic blood pressure was in the 70s, and improved to 80s with normal saline bolus. It is reported that he became somewhat more responsive with two doses of Narcan. He was admitted initially to the CCU. He was given 1000 mg IV loading dose of Cerebyx. Phenytoin was discontinued in the hospital after he began taking oral medications and receiving his usual regimen. In the hospital, he has had some brief seizures, the last occurring the morning of 01/15/2012.    His caregiver at [facility] reports that frequency of his seizure spells in the past few months has been about two to three a month, much decreased when the patient has been kept away from significant stimulation in his environment. Typically with a spell he may look at someone or tap someone to indicate that there is something happening and then will be seen to lie or tip back and be unconscious and may have some upper body shaking for 30 to 60 seconds altogether thereafter which he will seem somewhat confused for a few minutes before being in his usual state. He has a vagus nerve stimulator and has a magnet with him all the time so the stimulator can be activated if needed for a spell lasting more than 60 seconds.   PAST SURGICAL HISTORY: He underwent placement of a vagus nerve stimulator on 06/21/1998. He had placement of vagus nerve stimulator pulse generator/battery on 12/31/2000. He underwent replacement of the vagal nerve stimulator on 07/27/2008. In 1989 he underwent corpus callosotomy.   PAST MEDICAL HISTORY: This is most notable for the items  listed in the first line above. He has history of glaucoma. He does not have a history of problems with blood sugar control.   MEDICATIONS: His usual medications include the following. 1. Felbamate 600 mg taking two in the morning and three at  night. 2. Vimpat 100 mg in the morning and 200 mg at night. 3. Tegretol 200 mg taken three twice a day.  4. Diazepam 15 mg per rectum as needed for continuous seizure activity.  5. Amlodipine 2.5 mg a day. 6. Lisinopril 20 mg twice a day. 7. Lasix 20 mg a day. 8. Singulair 10 mg at night. 9. DuoNeb treatment every four hours as needed. 10. Ditropan 10 mg twice a day. 11. Protonix 40 mg a day. 12. Effexor-XR 75 mg a day. 13. Xalatan eye drops to each eye at night.   PHYSICAL EXAMINATION: The patient is a well developed and moderately overweight white gentleman who was examined lying semisupine, in no apparent distress, with blood pressure 155/80 and heart rate 64. There was no fever. He had no signs of head trauma and had supple neck. He appeared alert and visually followed the examiner to right and left, without spontaneous speech and without verbal response to questions with the exception that when asked his name he responded "Benjamin Williams". He did not follow commands with the exception that he would grasp with his hands, keep upper extremities elevated once lifted, give resistance to upper extremity movements and for lower extremity extension movements with tactile and verbal cues. Cranial nerve examination was notable for relative exotropia of the right eye. On motor examination, there was apparent full strength in the right arm and leg and mild weakness on the left rated 4 to 4-/5. Reflexes were rated 2+ on the right and 2+ on the left. His gait was not tested. He was not seen to have any abnormal movements.   IMPRESSION:  1. 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 2. Episode on 01/11/2012 of loss of consciousness with apparent respiratory/cardiac arrest, resolved within two minutes with then hypotension and report of various heart  rates, and initial blood sugar around 40, but low values appearing in the ER, of unclear etiology. I do not suspect that this episode was secondary to seizure.  3. He has had brief seizure spells in the hospital.  From the description of seizure frequency from the staff member from [facility] on the telephone, his having one or more brief spells a day would not be atypical for him in an unfamiliar and somewhat stimulating environment.   RECOMMENDATIONS:  1. Continue his present antiseizure regimen. 2. Followup with Gaylyn Cheers of [facility], scheduled for 1:00 p.m. on 02/20/2012.  3. In regard to his episode precipitating his admission, consider cardiac evaluation. Of note, he did not have elevation of cardiac enzymes on admission and had EKG showing sinus rhythm. Also not noted above, brain CT scan on admission showed no evidence of acute process.   I appreciate being asked to see this interesting gentleman.  ____________________________ Benjamin Phi. Kemper Durie, MD prc:slb D: 01/15/2012 12:49:25 ET     T: 01/15/2012 13:11:52 ET        JOB#: [id number] cc: Benjamin Phi. Kemper Durie, MD, <Dictator> Gaylyn Cheers, [place]  ([facility]) Gaspar Garbe MD ELECTRONICALLY SIGNED 01/23/2012 10:49

## 2014-08-16 ENCOUNTER — Other Ambulatory Visit
Admission: RE | Admit: 2014-08-16 | Discharge: 2014-08-16 | Disposition: A | Discharge status: Home / Self Care | Payer: Medicare Other | Source: Ambulatory Visit | Attending: Physician Assistant | Admitting: Physician Assistant

## 2014-08-16 DIAGNOSIS — R030 Elevated blood-pressure reading, without diagnosis of hypertension: Secondary | ICD-10-CM | POA: Insufficient documentation

## 2014-08-16 DIAGNOSIS — G40909 Epilepsy, unspecified, not intractable, without status epilepticus: Secondary | ICD-10-CM | POA: Insufficient documentation

## 2014-08-16 LAB — BASIC METABOLIC PANEL
Anion gap: 13 (ref 5–15)
BUN: 18 mg/dL (ref 6–20)
CHLORIDE: 106 mmol/L (ref 101–111)
CO2: 21 mmol/L — AB (ref 22–32)
CREATININE: 0.85 mg/dL (ref 0.61–1.24)
Calcium: 9.8 mg/dL (ref 8.9–10.3)
GFR calc Af Amer: >60 mL/min (ref >60)
GFR calc non Af Amer: >60 mL/min (ref >60)
GLUCOSE: 106 mg/dL — AB (ref 65–99)
Potassium: 3.8 mmol/L (ref 3.5–5.1)
Sodium: 140 mmol/L (ref 135–145)

## 2014-08-16 LAB — CBC
HEMATOCRIT: 37.7 % — AB (ref 40.0–52.0)
Hemoglobin: 12.7 g/dL — ABNORMAL LOW (ref 13.0–18.0)
MCH: 31.5 pg (ref 26.0–34.0)
MCHC: 33.8 g/dL (ref 32.0–36.0)
MCV: 93.1 fL (ref 80.0–100.0)
Platelets: 235 10*3/uL (ref 150–440)
RBC: 4.05 MIL/uL — ABNORMAL LOW (ref 4.40–5.90)
RDW: 14.9 % — ABNORMAL HIGH (ref 11.5–14.5)
WBC: 9.5 10*3/uL (ref 3.8–10.6)

## 2015-02-23 ENCOUNTER — Other Ambulatory Visit
Admission: RE | Admit: 2015-02-23 | Discharge: 2015-02-23 | Disposition: A | Discharge status: Home / Self Care | Payer: Medicare Other | Source: Skilled Nursing Facility | Attending: Physician Assistant | Admitting: Physician Assistant

## 2015-02-23 DIAGNOSIS — N172 Acute kidney failure with medullary necrosis: Secondary | ICD-10-CM | POA: Insufficient documentation

## 2015-02-23 LAB — BASIC METABOLIC PANEL
ANION GAP: 3 — AB (ref 5–15)
BUN: 17 mg/dL (ref 6–20)
CHLORIDE: 104 mmol/L (ref 101–111)
CO2: 25 mmol/L (ref 22–32)
Calcium: 9.5 mg/dL (ref 8.9–10.3)
Creatinine, Ser: 0.7 mg/dL (ref 0.61–1.24)
GFR calc non Af Amer: >60 mL/min (ref >60)
Glucose, Bld: 100 mg/dL — ABNORMAL HIGH (ref 65–99)
POTASSIUM: 4 mmol/L (ref 3.5–5.1)
Sodium: 132 mmol/L — ABNORMAL LOW (ref 135–145)

## 2015-02-23 LAB — CBC WITH DIFFERENTIAL/PLATELET
BASOS ABS: 0.1 10*3/uL (ref 0–0.1)
Basophils Relative: 1 %
Eosinophils Absolute: 0.1 10*3/uL (ref 0–0.7)
Eosinophils Relative: 1 %
HEMATOCRIT: 38.9 % — AB (ref 40.0–52.0)
Hemoglobin: 13.3 g/dL (ref 13.0–18.0)
Lymphocytes Relative: 19 %
Lymphs Abs: 1.6 10*3/uL (ref 1.0–3.6)
MCH: 30.6 pg (ref 26.0–34.0)
MCHC: 34.3 g/dL (ref 32.0–36.0)
MCV: 89.3 fL (ref 80.0–100.0)
MONOS PCT: 13 %
Monocytes Absolute: 1.1 10*3/uL — ABNORMAL HIGH (ref 0.2–1.0)
NEUTROS ABS: 5.5 10*3/uL (ref 1.4–6.5)
Neutrophils Relative %: 66 %
Platelets: 232 10*3/uL (ref 150–440)
RBC: 4.36 MIL/uL — AB (ref 4.40–5.90)
RDW: 14.3 % (ref 11.5–14.5)
WBC: 8.3 10*3/uL (ref 3.8–10.6)

## 2016-01-04 ENCOUNTER — Other Ambulatory Visit
Admission: RE | Admit: 2016-01-04 | Discharge: 2016-01-04 | Disposition: A | Discharge status: Home / Self Care | Payer: Medicare Other | Source: Ambulatory Visit | Attending: Family Medicine | Admitting: Family Medicine

## 2016-01-04 DIAGNOSIS — N390 Urinary tract infection, site not specified: Secondary | ICD-10-CM | POA: Diagnosis present

## 2016-01-04 LAB — URINALYSIS COMPLETE WITH MICROSCOPIC (ARMC ONLY)
BILIRUBIN URINE: NEGATIVE
Glucose, UA: NEGATIVE mg/dL
KETONES UR: NEGATIVE mg/dL
Leukocytes, UA: NEGATIVE
NITRITE: NEGATIVE
Protein, ur: 100 mg/dL — AB
Specific Gravity, Urine: 1.03 (ref 1.005–1.030)
pH: 6 (ref 5.0–8.0)

## 2016-01-06 LAB — URINE CULTURE

## 2016-11-03 ENCOUNTER — Encounter: Payer: Self-pay | Admitting: Internal Medicine

## 2016-11-03 ENCOUNTER — Emergency Department: Payer: Medicare Other

## 2016-11-03 DIAGNOSIS — I2102 ST elevation (STEMI) myocardial infarction involving left anterior descending coronary artery: Principal | ICD-10-CM | POA: Diagnosis present

## 2016-11-03 DIAGNOSIS — I2510 Atherosclerotic heart disease of native coronary artery without angina pectoris: Secondary | ICD-10-CM | POA: Diagnosis present

## 2016-11-03 DIAGNOSIS — Z7401 Bed confinement status: Secondary | ICD-10-CM

## 2016-11-03 DIAGNOSIS — I509 Heart failure, unspecified: Secondary | ICD-10-CM | POA: Diagnosis present

## 2016-11-03 DIAGNOSIS — I639 Cerebral infarction, unspecified: Secondary | ICD-10-CM

## 2016-11-03 DIAGNOSIS — Z515 Encounter for palliative care: Secondary | ICD-10-CM | POA: Diagnosis present

## 2016-11-03 DIAGNOSIS — R402212 Coma scale, best verbal response, none, at arrival to emergency department: Secondary | ICD-10-CM | POA: Diagnosis present

## 2016-11-03 DIAGNOSIS — I63531 Cerebral infarction due to unspecified occlusion or stenosis of right posterior cerebral artery: Secondary | ICD-10-CM

## 2016-11-03 DIAGNOSIS — J9600 Acute respiratory failure, unspecified whether with hypoxia or hypercapnia: Secondary | ICD-10-CM | POA: Diagnosis present

## 2016-11-03 DIAGNOSIS — E785 Hyperlipidemia, unspecified: Secondary | ICD-10-CM | POA: Diagnosis present

## 2016-11-03 DIAGNOSIS — G4733 Obstructive sleep apnea (adult) (pediatric): Secondary | ICD-10-CM | POA: Diagnosis present

## 2016-11-03 DIAGNOSIS — I63533 Cerebral infarction due to unspecified occlusion or stenosis of bilateral posterior cerebral arteries: Secondary | ICD-10-CM | POA: Diagnosis present

## 2016-11-03 DIAGNOSIS — Z66 Do not resuscitate: Secondary | ICD-10-CM | POA: Diagnosis present

## 2016-11-03 DIAGNOSIS — R402312 Coma scale, best motor response, none, at arrival to emergency department: Secondary | ICD-10-CM | POA: Diagnosis present

## 2016-11-03 DIAGNOSIS — R4182 Altered mental status, unspecified: Secondary | ICD-10-CM | POA: Diagnosis present

## 2016-11-03 DIAGNOSIS — Z888 Allergy status to other drugs, medicaments and biological substances status: Secondary | ICD-10-CM

## 2016-11-03 DIAGNOSIS — I2109 ST elevation (STEMI) myocardial infarction involving other coronary artery of anterior wall: Secondary | ICD-10-CM | POA: Diagnosis present

## 2016-11-03 DIAGNOSIS — G40909 Epilepsy, unspecified, not intractable, without status epilepticus: Secondary | ICD-10-CM | POA: Diagnosis present

## 2016-11-03 DIAGNOSIS — I63532 Cerebral infarction due to unspecified occlusion or stenosis of left posterior cerebral artery: Secondary | ICD-10-CM

## 2016-11-03 DIAGNOSIS — I110 Hypertensive heart disease with heart failure: Secondary | ICD-10-CM | POA: Diagnosis present

## 2016-11-03 DIAGNOSIS — R402112 Coma scale, eyes open, never, at arrival to emergency department: Secondary | ICD-10-CM | POA: Diagnosis present

## 2016-11-03 DIAGNOSIS — Z87891 Personal history of nicotine dependence: Secondary | ICD-10-CM | POA: Diagnosis not present

## 2016-11-03 DIAGNOSIS — I219 Acute myocardial infarction, unspecified: Secondary | ICD-10-CM

## 2016-11-03 DIAGNOSIS — R4189 Other symptoms and signs involving cognitive functions and awareness: Secondary | ICD-10-CM

## 2016-11-03 LAB — BASIC METABOLIC PANEL
ANION GAP: 13 (ref 5–15)
BUN: 32 mg/dL — ABNORMAL HIGH (ref 6–20)
CALCIUM: 10.2 mg/dL (ref 8.9–10.3)
CO2: 24 mmol/L (ref 22–32)
Chloride: 109 mmol/L (ref 101–111)
Creatinine, Ser: 1.25 mg/dL — ABNORMAL HIGH (ref 0.61–1.24)
GFR calc Af Amer: >60 mL/min (ref >60)
GFR, EST NON AFRICAN AMERICAN: 59 mL/min — AB (ref >60)
Glucose, Bld: 126 mg/dL — ABNORMAL HIGH (ref 65–99)
POTASSIUM: 5.1 mmol/L (ref 3.5–5.1)
Sodium: 146 mmol/L — ABNORMAL HIGH (ref 135–145)

## 2016-11-03 LAB — CBC WITH DIFFERENTIAL/PLATELET
BASOS ABS: 0.2 10*3/uL — AB (ref 0–0.1)
BASOS PCT: 1 %
Eosinophils Absolute: 0 10*3/uL (ref 0–0.7)
Eosinophils Relative: 0 %
HEMATOCRIT: 34.9 % — AB (ref 40.0–52.0)
Hemoglobin: 11.4 g/dL — ABNORMAL LOW (ref 13.0–18.0)
LYMPHS PCT: 9 %
Lymphs Abs: 1.1 10*3/uL (ref 1.0–3.6)
MCH: 30.2 pg (ref 26.0–34.0)
MCHC: 32.7 g/dL (ref 32.0–36.0)
MCV: 92.3 fL (ref 80.0–100.0)
MONO ABS: 1.8 10*3/uL — AB (ref 0.2–1.0)
Monocytes Relative: 13 %
NEUTROS ABS: 10.4 10*3/uL — AB (ref 1.4–6.5)
Neutrophils Relative %: 77 %
PLATELETS: 330 10*3/uL (ref 150–440)
RBC: 3.77 MIL/uL — AB (ref 4.40–5.90)
RDW: 14 % (ref 11.5–14.5)
WBC: 13.5 10*3/uL — AB (ref 3.8–10.6)

## 2016-11-03 LAB — TROPONIN I: Troponin I: 22.21 ng/mL (ref <0.03)

## 2016-11-03 MED ORDER — CHLORHEXIDINE GLUCONATE 0.12 % MT SOLN
15.0000 mL | Freq: Two times a day (BID) | OROMUCOSAL | Status: DC
  Administered 2016-11-04: 13:00:00 15 mL via OROMUCOSAL
  Filled 2016-11-03: qty 15

## 2016-11-03 MED ORDER — SODIUM CHLORIDE 0.9 % IV SOLN
4.0000 mg/h | INTRAVENOUS | Status: DC
  Administered 2016-11-03: 2 mg/h via INTRAVENOUS
  Filled 2016-11-03: qty 10

## 2016-11-03 MED ORDER — LORAZEPAM 2 MG/ML IJ SOLN
0.5000 mg | INTRAMUSCULAR | Status: DC | PRN
  Administered 2016-11-03 – 2016-11-04 (×2): 0.5 mg via INTRAVENOUS
  Filled 2016-11-03 (×2): qty 1

## 2016-11-03 MED ORDER — ACETAMINOPHEN 325 MG PO TABS: 650.0000 mg | ORAL_TABLET | Freq: Four times a day (QID) | ORAL | Status: DC | PRN

## 2016-11-03 MED ORDER — MORPHINE SULFATE (PF) 4 MG/ML IV SOLN
4.0000 mg | Freq: Once | INTRAVENOUS | Status: AC
  Administered 2016-11-03: 4 mg via INTRAVENOUS
  Filled 2016-11-03: qty 1

## 2016-11-03 MED ORDER — BISACODYL 10 MG RE SUPP: 10.0000 mg | Freq: Every day | RECTAL | Status: DC | PRN

## 2016-11-03 MED ORDER — SODIUM CHLORIDE 0.9 % IV SOLN: INTRAVENOUS | Status: DC

## 2016-11-03 MED ORDER — MORPHINE SULFATE (PF) 2 MG/ML IV SOLN
2.0000 mg | Freq: Once | INTRAVENOUS | Status: AC
  Administered 2016-11-03: 2 mg via INTRAVENOUS
  Filled 2016-11-03: qty 1

## 2016-11-03 MED ORDER — LORAZEPAM 2 MG/ML IJ SOLN
1.0000 mg | Freq: Once | INTRAMUSCULAR | Status: AC
  Administered 2016-11-03: 1 mg via INTRAVENOUS
  Filled 2016-11-03: qty 1

## 2016-11-03 MED ORDER — ASPIRIN 300 MG RE SUPP: 300.0000 mg | Freq: Once | RECTAL | Status: DC

## 2016-11-03 MED ORDER — ONDANSETRON HCL 4 MG PO TABS: 4.0000 mg | ORAL_TABLET | Freq: Four times a day (QID) | ORAL | Status: DC | PRN

## 2016-11-03 MED ORDER — MORPHINE BOLUS VIA INFUSION
2.0000 mg | INTRAVENOUS | Status: DC | PRN
  Administered 2016-11-03 (×3): 2 mg via INTRAVENOUS
  Filled 2016-11-03: qty 2

## 2016-11-03 MED ORDER — ONDANSETRON HCL 4 MG/2ML IJ SOLN: 4.0000 mg | Freq: Four times a day (QID) | INTRAMUSCULAR | Status: DC | PRN

## 2016-11-03 MED ORDER — ACETAMINOPHEN 650 MG RE SUPP: 650.0000 mg | Freq: Four times a day (QID) | RECTAL | Status: DC | PRN

## 2016-11-03 MED ORDER — ORAL CARE MOUTH RINSE
15.0000 mL | Freq: Two times a day (BID) | OROMUCOSAL | Status: DC
  Administered 2016-11-03 – 2016-11-04 (×3): 15 mL via OROMUCOSAL

## 2016-11-03 MED ORDER — FENTANYL CITRATE (PF) 100 MCG/2ML IJ SOLN
75.0000 ug | Freq: Once | INTRAMUSCULAR | Status: AC
  Administered 2016-11-03: 75 ug via INTRAVENOUS
  Filled 2016-11-03: qty 2

## 2016-11-03 NOTE — ED Notes (Signed)
ED Provider at bedside. 

## 2016-11-03 NOTE — ED Notes (Signed)
Patient transported to CT 

## 2016-11-03 NOTE — ED Notes (Signed)
Pt back from CT

## 2016-11-03 NOTE — ED Notes (Signed)
Family at bedside. Pending pharmacy to mix IV morphine drip. IV morphine now order received from MD. Will administer as ordered.

## 2016-11-03 NOTE — ED Provider Notes (Signed)
[facility] Department Provider Note ____________________________________________   I have reviewed the triage vital signs and the triage nursing note.  HISTORY  Chief Complaint Code STEMI and Seizures   Historian Limited history from patient as he is unresponsive EMS providers report from nursing home staff Sister, guardian at bedside and provided additional history  HPI Benjamin Williams is a 66 y.o. male with a history of being bedbound and somewhat limited mental status and interaction due to a history of severe epilepsy with a history of childhood surgical procedures that left him with deficits, presents today from nursing home with complaint of altered mental status, possible seizure, and EMS EKG showing anterior STEMI  Report obtained through EMS from the nursing home is that patient had a seizure last night where her eyes were deviated to one side. He was treated in his typical fashion. This morning he apparently was altered again and eyes were deviated to the other side and there was concern about this being unusual for his typical seizures. EMS was called. EMS alerted STEMI after obtaining EKG and coming in for transport.  A DO NOT RESUSCITATE order was brought with him.    History reviewed. No pertinent past medical history.  Patient Active Problem List   Diagnosis Date Noted  . CVA (cerebral vascular accident) (HCC) 10/25/2016  . Acute MI (HCC) 11/05/2016  . Acute respiratory failure (HCC) 10/21/2016  . Unresponsiveness 11/05/2016    History reviewed. No pertinent surgical history.  Prior to Admission medications   Medication Sig Start Date End Date Taking? Authorizing Provider  amLODipine (NORVASC) 10 MG tablet Take 10 mg by mouth daily.   Yes [provider]  bisacodyl (DULCOLAX) 10 MG suppository Place 10 mg rectally every other day. Give one suppository every other day at bedtime.   Yes [provider]   carbamazepine (TEGRETOL) 200 MG tablet Take 600 mg by mouth 2 (two) times daily.   Yes [provider]  felbamate (FELBATOL) 600 MG tablet Take 1,200-1,800 mg by mouth 2 (two) times daily. Take 1200 mg by mouth in the morning and take 1800 mg by mouth at bedtime.   Yes [provider]  furosemide (LASIX) 20 MG tablet Take 20 mg by mouth daily.   Yes [provider]  lacosamide (VIMPAT) 200 MG TABS tablet Take 200 mg by mouth 2 (two) times daily.   Yes [provider]  latanoprost (XALATAN) 0.005 % ophthalmic solution Place 1 drop into both eyes at bedtime.   Yes [provider]  lisinopril (PRINIVIL,ZESTRIL) 2.5 MG tablet Take 2.5 mg by mouth daily. Take along with 5 mg tablet to total 7.5 mg daily.   Yes [provider]  lisinopril (PRINIVIL,ZESTRIL) 5 MG tablet Take 5 mg by mouth daily. Take along with 2.5 mg tablet to total 7.5 mg daily.   Yes [provider]  LORazepam (ATIVAN) 2 MG/ML concentrated solution Take 0.5 mg by mouth daily as needed for seizure (Seziure lasting greater than 1 minute).   Yes [provider]  metoprolol tartrate (LOPRESSOR) 25 MG tablet Take 25 mg by mouth 2 (two) times daily.   Yes [provider]  polyethylene glycol (MIRALAX / GLYCOLAX) packet Take 17 g by mouth daily.   Yes [provider]  promethazine (PHENERGAN) 25 MG suppository Place 25 mg rectally every 6 (six) hours as needed for nausea or vomiting.   Yes [provider]    Allergies  Allergen Reactions  .  Methylphenidate Derivatives     History reviewed. No pertinent family history.  Social History Social History  Substance Use Topics  . Smoking status: Former Games developer  . Smokeless tobacco: Never Used  . Alcohol use Not on file    Review of Systems  Limited by critical illness  but no report of recent illnesses, fevers, or altered mental status from baseline until last night/this  morning.  ____________________________________________   PHYSICAL EXAM:  VITAL SIGNS: ED Triage Vitals  Enc Vitals Group     BP      Pulse      Resp      Temp      Temp src      SpO2      Weight      Height      Head Circumference      Peak Flow      Pain Score      Pain Loc      Pain Edu?      Excl. in GC?      Constitutional: Opens his eyes occasionally, but not focusing. Tachypnea. HEENT   Head: Normocephalic and atraumatic.      Eyes: Conjunctivae are normal. Pupils equal and round.       Ears:         Nose: No congestion/rhinnorhea.   Mouth/Throat: Mucous membranes are moist.   Neck: No stridor. Cardiovascular/Chest: Tachycardic, regular rhythm.  No murmurs, rubs, or gallops. Respiratory: Tachypnea, rhonchorous. No wheezing. Suspect rales. Gastrointestinal: Soft. No distention. Genitourinary/rectal:Deferred Musculoskeletal: No lower extremity edema. Neurologic:  No facial droop. Nonverbal. Not following any commands. He does seem to move 4 extremities.  Skin:  Skin is warm, dry and intact. No rash noted.   ____________________________________________  LABS (pertinent positives/negatives)  Labs Reviewed  CBC WITH DIFFERENTIAL/PLATELET - Abnormal; Notable for the following:       Result Value   WBC 13.5 (*)    RBC 3.77 (*)    Hemoglobin 11.4 (*)    HCT 34.9 (*)    Neutro Abs 10.4 (*)    Monocytes Absolute 1.8 (*)    Basophils Absolute 0.2 (*)    All other components within normal limits  BASIC METABOLIC PANEL - Abnormal; Notable for the following:    Sodium 146 (*)    Glucose, Bld 126 (*)    BUN 32 (*)    Creatinine, Ser 1.25 (*)    GFR calc non Af Amer 59 (*)    All other components within normal limits  TROPONIN I - Abnormal; Notable for the following:    Troponin I 22.21 (*)    All other components within normal limits    ____________________________________________    EKG I, Governor Rooks, MD, the attending physician have  personally viewed and interpreted all ECGs.  134 bpm. Sinus tachycardia. Nonspecific intraventricular conduction delay. ST segment elevation V2 through V4 consistent with anterior STEMI. ____________________________________________  RADIOLOGY All Xrays were viewed by me. Imaging interpreted by Radiologist.  Chest x-ray portable:  IMPRESSION: Borderline cardiomegaly with diffuse bilateral mild interstitial prominence suggests mild CHF.  CT without contrast:  IMPRESSION: Low-attenuation throughout the left thalamus with confluent low-attenuation over or bilateral PCA territories compatible with infarction likely of acute to subacute nature. No acute hemorrhage.  Chronic ischemic microvascular disease. __________________________________________  PROCEDURES  Procedure(s) performed: None  Critical Care performed: CRITICAL CARE Performed by: Governor Rooks   Total critical care time: 60 minutes  Critical care time was exclusive of separately billable  procedures and treating other patients.  Critical care was necessary to treat or prevent imminent or life-threatening deterioration.  Critical care was time spent personally by me on the following activities: development of treatment plan with patient and/or surrogate as well as nursing, discussions with consultants, evaluation of patient's response to treatment, examination of patient, obtaining history from patient or surrogate, ordering and performing treatments and interventions, ordering and review of laboratory studies, ordering and review of radiographic studies, pulse oximetry and re-evaluation of patient's condition.   ____________________________________________   ED COURSE / ASSESSMENT AND PLAN  Pertinent labs & imaging results that were available during my care of the patient were reviewed by me and considered in my medical decision making (see chart for details).    Mr. Georganna Skeans presented by EMS under STEMI alert. I  reviewed his prehospital EKG and there is evidence for anterior STEMI.  Patient looked extremely critical on arrival, to, altered, not responding, concerned that he is not protecting his airway. Patient came with DO NOT RESUSCITATE order. He has been apparently bedbound and minimal interactive capability, and I immediately called next of kin, his guardian, his sister, Tacey Ruiz.  I was able to contact her immediately and discuss his critical nature and find out if they were interested in pursuing her work invasive measures including intubation and cardiac cath. She indicated that it was her wishes and the family's wishes and guidance that the patient would not undergo intubation, ventilation, or cardiac cath.  We had discussed that his overall quality of life and underlying illnesses would have made it concerning for getting off of the ventilator, and even surviving a cardiac catheterization.  From this point I was able to go ahead and attempt further investigation, knowing that we were not pursuing intubation, ventilation or other invasive treatments.  Patient has a history of chronic and frequent seizures. He did not appear to be showing obvious seizure activity here. I think his symptoms are mostly coming from the acute MI      CONSULTATIONS:   Dr. Darrold Junker through STEMI alert, called off due to a chin critical and DO NOT RESUSCITATE and family does not want to pursue invasive treatments. Hospitalist for admission for comfort care.   Patient / Family / Caregiver informed of clinical course, medical decision-making process, and agree with plan.  ___________________________________________   FINAL CLINICAL IMPRESSION(S) / ED DIAGNOSES   Final diagnoses:  STEMI involving left anterior descending coronary artery (HCC)  Altered mental status, unspecified altered mental status type  Acute left PCA stroke (HCC)  Acute right PCA stroke (HCC)  Acute congestive heart failure, unspecified heart  failure type [facility])              Note: This dictation was prepared with Dragon dictation. Any transcriptional errors that result from this process are unintentional    Governor Rooks, MD 10/24/2016 1115

## 2016-11-03 NOTE — ED Notes (Signed)
Pt with agonal respirations.  02 increased to 6L via [place]. Respiratory rate 46. Family at bedside.

## 2016-11-03 NOTE — H&P (Signed)
History and Physical    Benjamin Williams:[id number] DOB: [birth year] DOA: 2016-11-27  Referring physician: Dr. Shaune Pollack PCP: No primary care provider on file.  Specialists: none  Chief Complaint: unresponsive  HPI: Benjamin Williams is a 66 y.o. male has a past medical history significant for CAD, HTN, HLD, and OSA from [facility] found unresponsive. Brought to ER where EGK shows anterior ST elevation and troponin elevated c/w STEMI. Also found to have acute CVA on head CT. Pt is unresponsive with agonal respirations. Family requests comfort care only. He is now admitted for terminal care.   Review of Systems: unable to obtain due to pt's unresponsiveness  History reviewed. No pertinent past medical history. History reviewed. No pertinent surgical history. Social History:  reports that he has quit smoking. He has never used smokeless tobacco. His alcohol and drug histories are not on file.  Allergies  Allergen Reactions  . Methylphenidate Derivatives     History reviewed. No pertinent family history.  Prior to Admission medications   Medication Sig Start Date End Date Taking? Authorizing Provider  amLODipine (NORVASC) 10 MG tablet Take 10 mg by mouth daily.   Yes [provider]  bisacodyl (DULCOLAX) 10 MG suppository Place 10 mg rectally every other day. Give one suppository every other day at bedtime.   Yes [provider]  carbamazepine (TEGRETOL) 200 MG tablet Take 600 mg by mouth 2 (two) times daily.   Yes [provider]  felbamate (FELBATOL) 600 MG tablet Take 1,200-1,800 mg by mouth 2 (two) times daily. Take 1200 mg by mouth in the morning and take 1800 mg by mouth at bedtime.   Yes [provider]  furosemide (LASIX) 20 MG tablet Take 20 mg by mouth daily.   Yes [provider]  lacosamide (VIMPAT) 200 MG TABS tablet Take 200 mg by mouth 2 (two) times daily.   Yes [provider]  latanoprost (XALATAN) 0.005 % ophthalmic solution  Place 1 drop into both eyes at bedtime.   Yes [provider]  lisinopril (PRINIVIL,ZESTRIL) 2.5 MG tablet Take 2.5 mg by mouth daily. Take along with 5 mg tablet to total 7.5 mg daily.   Yes [provider]  lisinopril (PRINIVIL,ZESTRIL) 5 MG tablet Take 5 mg by mouth daily. Take along with 2.5 mg tablet to total 7.5 mg daily.   Yes [provider]  LORazepam (ATIVAN) 2 MG/ML concentrated solution Take 0.5 mg by mouth daily as needed for seizure (Seziure lasting greater than 1 minute).   Yes [provider]  metoprolol tartrate (LOPRESSOR) 25 MG tablet Take 25 mg by mouth 2 (two) times daily.   Yes [provider]  polyethylene glycol (MIRALAX / GLYCOLAX) packet Take 17 g by mouth daily.   Yes [provider]  promethazine (PHENERGAN) 25 MG suppository Place 25 mg rectally every 6 (six) hours as needed for nausea or vomiting.   Yes [provider]   Physical Exam: Vitals:   2016/11/27 0930 11-27-2016 0945 2016-11-27 1000 11-27-16 1016  BP: 131/89 127/81 117/69 116/83  Pulse: (!) 133 (!) 135 (!) 134 (!) 135  Resp: (!) 30 (!) 37 (!) 44   Temp:      TempSrc:      SpO2: 97% 94% 99% 97%  Weight:      Height:         General:  [name], WDWN, in severe respiratory distress with agonal respirations  Eyes: PERRL, EOMI, no scleral icterus, conjunctiva clear  ENT: moist oropharynx without exudate, TM's benign, dentition fair  Neck: supple, no lymphadenopathy. No bruits or thyromegaly  Cardiovascular: rapid rate with regular rhythm without MRG; 2+ peripheral pulses, no JVD, trace peripheral edema  Respiratory: diffuse rhonchi with agonal respirations. No wheezes. Faint basilar rales. Respiratory effort in creased  Abdomen: soft, non tender to palpation, positive bowel sounds, no guarding, no rebound  Skin: no rashes or lesions  Musculoskeletal: normal bulk and tone, no joint swelling  Psychiatric: unresponsive  Neurologic: unable  to assess  Labs on Admission:  Basic Metabolic Panel:  Recent Labs Lab 11-11-16 0855  NA 146*  K 5.1  CL 109  CO2 24  GLUCOSE 126*  BUN 32*  CREATININE 1.25*  CALCIUM 10.2   Liver Function Tests: No results for input(s): AST, ALT, ALKPHOS, BILITOT, PROT, ALBUMIN in the last 168 hours. No results for input(s): LIPASE, AMYLASE in the last 168 hours. No results for input(s): AMMONIA in the last 168 hours. CBC:  Recent Labs Lab 11/11/16 0812  WBC 13.5*  NEUTROABS 10.4*  HGB 11.4*  HCT 34.9*  MCV 92.3  PLT 330   Cardiac Enzymes:  Recent Labs Lab 11/11/16 0855  TROPONINI 22.21*    BNP (last 3 results) No results for input(s): BNP in the last 8760 hours.  ProBNP (last 3 results) No results for input(s): PROBNP in the last 8760 hours.  CBG: No results for input(s): GLUCAP in the last 168 hours.  Radiological Exams on Admission: Ct Head Wo Contrast  Result Date: 11/11/16 CLINICAL DATA:  Seizures. EXAM: CT HEAD WITHOUT CONTRAST TECHNIQUE: Contiguous axial images were obtained from the base of the skull through the vertex without intravenous contrast. COMPARISON:  01/11/2012 FINDINGS: Brain: Stable mild asymmetry of the lateral ventricles right greater than left with evidence of cavum septum variant. Third and fourth ventricles are within normal. Cisterns and remaining CSF spaces are within normal. There is chronic ischemic microvascular disease present. There is low-attenuation over the left thalamus extending in a comparable manner into the left PCA territory as there is also low-attenuation over the right PCA territory compatible with infarction likely acute to subacute in nature. No evidence of mass, mass effect or shift midline structures. No acute hemorrhage. Vascular: No hyperdense vessel or unexpected calcification. Skull: Evidence of previous right frontal craniotomy. Sinuses/Orbits: No acute finding. Other: Lipoma of the corpus callosum. IMPRESSION:  Low-attenuation throughout the left thalamus with confluent low-attenuation over or bilateral PCA territories compatible with infarction likely of acute to subacute nature. No acute hemorrhage. Chronic ischemic microvascular disease. Electronically Signed   By: Elberta Fortis M.D.   On: 11-11-16 08:50   Dg Chest Port 1 View  Result Date: 11-11-2016 CLINICAL DATA:  66 year old male status post seizures, unresponsive EXAM: PORTABLE CHEST 1 VIEW COMPARISON:  Prior chest x-ray 01/11/2012 FINDINGS: Limited chest radiographs given patient rotation toward the right. External defibrillator pads project over the chest. A cervical spinal stimulator projects over the left chest. Diffuse bilateral interstitial prominence slightly increased compared to prior imaging consistent with mild pulmonary edema. No focal airspace consolidation, pneumothorax or pleural effusion. No acute osseous abnormality. Borderline cardiomegaly. IMPRESSION: Borderline cardiomegaly with diffuse bilateral mild interstitial prominence suggests mild CHF. Electronically Signed   By: Malachy Moan M.D.   On: Nov 11, 2016 08:24    EKG: Independently reviewed.  Assessment/Plan Principal Problem:   Acute MI [facility]) Active Problems:   CVA (cerebral vascular accident) (HCC)   Acute respiratory failure (HCC)   Unresponsiveness   Will  admit to floor as DNR for comfort care with IV morphine drip and O2. IV Ativan as needed. Consult CM for Hospice care.   Diet: NPO Fluids: NS@KVO  DVT Prophylaxis: none  Code Status: DNR  Family Communication: yes  Disposition Plan: Hospice Home  Time spent: 55 min

## 2016-11-03 NOTE — ED Notes (Signed)
Lord MD at bedside speaking to family regarding prognosis. Comfort measures per MD order and family request. IV Morphine gtts verbal order given.

## 2016-11-03 NOTE — ED Notes (Signed)
Family at bedside. Lord MD in room to discuss plan of care.

## 2016-11-03 NOTE — ED Triage Notes (Signed)
Pt presents via EMS fro,m [facility] with seizures per EMS report. + STEMI. Pt is a DNR. Unresponsive. GCS 3.

## 2016-11-03 NOTE — Clinical Social Work Note (Signed)
CSW received consult for possible hospice home placement. The patient is not currently stable for transport; therefore, the patient can be assessed by [facility] in the morning to consider transport to the hospice home at that time. Due to agonal respiration and STEMI, ambulance transport may provide a barrier to death with dignity at this time. CSW will continue to follow for comfort of the family and any questions.  Argentina Ponder, MSW, Theresia Majors [phone]

## 2016-11-03 NOTE — ED Notes (Signed)
Seizure pads applied x2. Comfort care initiated by Shaune Pollack MD per family request.

## 2016-11-03 NOTE — Progress Notes (Signed)
Chaplain was paged to visit with patient and family due to patient being in comfort care. CH provided ministry of presence, grief support, and words of comfort. Also walked family to 106, once patient was transported.     11/21/2016 0830  Clinical Encounter Type  Visited With Patient;Patient and family together  Visit Type Initial;Spiritual support;ED;Patient actively dying  Referral From Nurse  Consult/Referral To Chaplain  Spiritual Encounters  Spiritual Needs Grief support;Other (Comment)

## 2016-11-03 NOTE — ED Notes (Signed)
Comfort basket given to pt. Sparks MD seen pt. Pending bed assignment. Will report when able.

## 2016-11-04 LAB — MRSA PCR SCREENING: MRSA by PCR: NEGATIVE

## 2016-11-04 MED ORDER — GLYCOPYRROLATE 0.2 MG/ML IJ SOLN
0.2000 mg | INTRAMUSCULAR | Status: DC | PRN
Start: 2016-11-04 — End: 2016-11-04
  Administered 2016-11-04 (×2): 0.2 mg via INTRAVENOUS
  Filled 2016-11-04 (×3): qty 1

## 2016-11-04 MED ORDER — SCOPOLAMINE 1 MG/3DAYS TD PT72
1.0000 | MEDICATED_PATCH | TRANSDERMAL | Status: DC
  Administered 2016-11-04: 02:00:00 1.5 mg via TRANSDERMAL
  Filled 2016-11-04: qty 1

## 2016-11-05 ENCOUNTER — Ambulatory Visit: Payer: Self-pay | Admitting: Urology

## 2016-11-13 NOTE — Progress Notes (Signed)
Nutrition Brief Note  Patient identified to be seen for low Braden score. Chart reviewed. Patient now transitioning to comfort care.   Patient is NPO. No nutrition interventions warranted at this time.  Please consult RD as needed.   Helane Rima, MS, RD, LDN Pager: [phone] After Hours Pager: [phone]

## 2016-11-13 NOTE — Care Management Note (Signed)
Case Management Note  Patient Details  Name: Benjamin Williams MRN: [id number] Date of Birth: 05-24-1950  Subjective/Objective:    Admitted to [facility] with the diagnosis of acute MI. A long term resident of [facility]. Sister is Orlie Dakin [phone]).  Morphine drip continues. Hospice placement. Comfort measures in place, Sister at the bedside.                 Action/Plan: Possible transfer to Hospice today.    Expected Discharge Date:                  Expected Discharge Plan:     In-House Referral:     Discharge planning Services     Post Acute Care Choice:    Choice offered to:     DME Arranged:    DME Agency:     HH Arranged:    HH Agency:     Status of Service:     If discussed at [employer], dates discussed:    Additional Comments:  Gwenette Greet, RN MSN CCM Care Management [phone] 11/11/2016, 8:56 AM

## 2016-11-13 NOTE — Progress Notes (Signed)
Pt is comfort care with morphine gtt infusing per MD order. Oral care and suctioning performed PRN and at family request. Pt given ativan once per PRN order with improvement noted. Pt turned and kept comfortable through the shift. Family at bedside through the night, Gone from [employer] book given and education provided for family and questions answered. Pt is a DNR and has allergies, appropriate bands have been placed on patient's arm.

## 2016-11-13 NOTE — Progress Notes (Signed)
[facility] - [place] [facility]   PATIENT NAME: Benjamin Williams    MR#:  [id number]  DATE OF BIRTH:  11-04-1950  SUBJECTIVE:   Came in after pt was found unresponsive at SNF. Found to have acute MI Pt is currently comfort measures only On IV morphine gtt. Sister/HCPOA in the room REVIEW OF SYSTEMS:   Review of Systems  Unable to perform ROS: Patient unresponsive   DRUG ALLERGIES:   Allergies  Allergen Reactions  . Methylphenidate Derivatives     VITALS:  Blood pressure (!) 111/54, pulse (!) 108, temperature (!) 103 F (39.4 C), temperature source Oral, resp. rate (!) 24, height 5\' 3"  (1.6 m), weight 81.6 kg (180 lb), SpO2 92 %.  PHYSICAL EXAMINATION:   Physical Exam Limited exam pt comfort care  GENERAL:  66 y.o.-year-old patient lying in the bed with no acute distress. Critically ill but comfortabl at present NECK:  Supple, no jugular venous distention. No thyroid enlargement, no tenderness.  LUNGS: coarse breath sounds bilaterally, no wheezing, positive rales, rhonchi. No use of accessory muscles of respiration.  CARDIOVASCULAR: S1, S2 normal. No murmurs, rubs, or gallops.  ABDOMEN: Soft, nontender, nondistended. EXTREMITIES: No cyanosis, clubbing or edema b/l.    PSYCHIATRIC:  patient is unresponsive   LABORATORY PANEL:  CBC  Recent Labs Lab 11/12/2016 0812  WBC 13.5*  HGB 11.4*  HCT 34.9*  PLT 330    Chemistries   Recent Labs Lab 10/13/2016 0855  NA 146*  K 5.1  CL 109  CO2 24  GLUCOSE 126*  BUN 32*  CREATININE 1.25*  CALCIUM 10.2   Cardiac Enzymes  Recent Labs Lab 10/31/2016 0855  TROPONINI 22.21*   RADIOLOGY:  Ct Head Wo Contrast  Result Date: 11/02/2016 CLINICAL DATA:  Seizures. EXAM: CT HEAD WITHOUT CONTRAST TECHNIQUE: Contiguous axial images were obtained from the base of the skull through the vertex without intravenous contrast. COMPARISON:  01/11/2012 FINDINGS: Brain: Stable mild asymmetry of the lateral  ventricles right greater than left with evidence of cavum septum variant. Third and fourth ventricles are within normal. Cisterns and remaining CSF spaces are within normal. There is chronic ischemic microvascular disease present. There is low-attenuation over the left thalamus extending in a comparable manner into the left PCA territory as there is also low-attenuation over the right PCA territory compatible with infarction likely acute to subacute in nature. No evidence of mass, mass effect or shift midline structures. No acute hemorrhage. Vascular: No hyperdense vessel or unexpected calcification. Skull: Evidence of previous right frontal craniotomy. Sinuses/Orbits: No acute finding. Other: Lipoma of the corpus callosum. IMPRESSION: Low-attenuation throughout the left thalamus with confluent low-attenuation over or bilateral PCA territories compatible with infarction likely of acute to subacute nature. No acute hemorrhage. Chronic ischemic microvascular disease. Electronically Signed   By: Elberta Fortis M.D.   On: 10/16/2016 08:50   Dg Chest Port 1 View  Result Date: 10/25/2016 CLINICAL DATA:  66 year old male status post seizures, unresponsive EXAM: PORTABLE CHEST 1 VIEW COMPARISON:  Prior chest x-ray 01/11/2012 FINDINGS: Limited chest radiographs given patient rotation toward the right. External defibrillator pads project over the chest. A cervical spinal stimulator projects over the left chest. Diffuse bilateral interstitial prominence slightly increased compared to prior imaging consistent with mild pulmonary edema. No focal airspace consolidation, pneumothorax or pleural effusion. No acute osseous abnormality. Borderline cardiomegaly. IMPRESSION: Borderline cardiomegaly with diffuse bilateral mild interstitial prominence suggests mild CHF. Electronically Signed   By: Isac Caddy.D.  On: 11/08/2016 08:24   ASSESSMENT AND PLAN:  Benjamin Williams is a 66 y.o. male has a past medical history  significant for CAD, HTN, HLD, and OSA from [facility] found unresponsive. Brought to ER where EGK shows anterior ST elevation and troponin elevated c/w STEMI. Also found to have acute CVA on head CT. Pt is unresponsive with agonal respirations. Family requests comfort care only.  * Acute STEMI and Acute CVA  -pt unresponsive and on IV morphine gtt -grim prognosis -Comfort care only. Sister aware and agrees with above -cont frequent suctioning -Transfer to Hospice home if bed available.    Case discussed with Care Management/Social Worker. Management plans discussed with the  family and they are in agreement.  CODE STATUS: DNR   TOTAL TIME TAKING CARE OF THIS PATIENT: 20 minutes.  >50% time spent on counselling and coordination of care    Note: This dictation was prepared with Dragon dictation along with smaller phrase technology. Any transcriptional errors that result from this process are unintentional.  [name] M.D on December 02, 2016 at 9:15 AM  Between 7am to 6pm - Pager - [phone]  After 6pm go to [email or web address] - password [email or web address]  [facility]  Office  [phone]  CC: Primary care physician; Patient, No Pcp Per

## 2016-11-13 DEATH — deceased

## 2016-12-14 NOTE — Discharge Summary (Signed)
DEATH SUMMARY   Patient Details  Name: Benjamin Williams MRN: [id number] DOB: 15-Jan-1951  Admission/Discharge Information   Admit Date:  11-09-2016  Date of Death: Date of Death: 11/10/2016  Time of Death: Time of Death: 08-10-1728  Length of Stay: 1  Referring Physician: Patient, No Pcp Per   Reason(s) for Hospitalization  Unresponsive  Diagnoses  Preliminary cause of death:   Acute STEMI Acute CVA   Brief Hospital Course (including significant findings, care, treatment, and services provided and events leading to death)   Benjamin Williams is a 66 y.o. male has a past medical history significant for CAD, HTN, HLD, and OSA from [facility] found unresponsive. Brought to ER where EGK shows anterior ST elevation and troponin elevated c/w STEMI. Also found to have acute CVA on head CT. Pt is unresponsive with agonal respirations. Family requests comfort care only. He was admitted for terminal care.  -pt unresponsive and on IV morphine gtt -grim prognosis -Comfort care only. Sister aware and agrees with above -pt died on 11-10-2016 at 1730 hours  Pertinent Labs and Studies  Significant Diagnostic Studies Ct Head Wo Contrast  Result Date: 11-09-16 CLINICAL DATA:  Seizures. EXAM: CT HEAD WITHOUT CONTRAST TECHNIQUE: Contiguous axial images were obtained from the base of the skull through the vertex without intravenous contrast. COMPARISON:  01/11/2012 FINDINGS: Brain: Stable mild asymmetry of the lateral ventricles right greater than left with evidence of cavum septum variant. Third and fourth ventricles are within normal. Cisterns and remaining CSF spaces are within normal. There is chronic ischemic microvascular disease present. There is low-attenuation over the left thalamus extending in a comparable manner into the left PCA territory as there is also low-attenuation over the right PCA territory compatible with infarction likely acute to subacute in nature. No evidence of mass, mass effect or shift midline  structures. No acute hemorrhage. Vascular: No hyperdense vessel or unexpected calcification. Skull: Evidence of previous right frontal craniotomy. Sinuses/Orbits: No acute finding. Other: Lipoma of the corpus callosum. IMPRESSION: Low-attenuation throughout the left thalamus with confluent low-attenuation over or bilateral PCA territories compatible with infarction likely of acute to subacute nature. No acute hemorrhage. Chronic ischemic microvascular disease. Electronically Signed   By: Elberta Fortis M.D.   On: 09-Nov-2016 08:50   Dg Chest Port 1 View  Result Date: 11-09-2016 CLINICAL DATA:  66 year old male status post seizures, unresponsive EXAM: PORTABLE CHEST 1 VIEW COMPARISON:  Prior chest x-ray 01/11/2012 FINDINGS: Limited chest radiographs given patient rotation toward the right. External defibrillator pads project over the chest. A cervical spinal stimulator projects over the left chest. Diffuse bilateral interstitial prominence slightly increased compared to prior imaging consistent with mild pulmonary edema. No focal airspace consolidation, pneumothorax or pleural effusion. No acute osseous abnormality. Borderline cardiomegaly. IMPRESSION: Borderline cardiomegaly with diffuse bilateral mild interstitial prominence suggests mild CHF. Electronically Signed   By: Malachy Moan M.D.   On: 2016/11/09 08:24    Microbiology No results found for this or any previous visit (from the past 240 hour(s)).  Lab Basic Metabolic Panel: No results for input(s): NA, K, CL, CO2, GLUCOSE, BUN, CREATININE, CALCIUM, MG, PHOS in the last 168 hours. Liver Function Tests: No results for input(s): AST, ALT, ALKPHOS, BILITOT, PROT, ALBUMIN in the last 168 hours. No results for input(s): LIPASE, AMYLASE in the last 168 hours. No results for input(s): AMMONIA in the last 168 hours. CBC: No results for input(s): WBC, NEUTROABS, HGB, HCT, MCV, PLT in the last 168 hours. Cardiac Enzymes: No  results for input(s):  CKTOTAL, CKMB, CKMBINDEX, TROPONINI in the last 168 hours. Sepsis Labs: No results for input(s): PROCALCITON, WBC, LATICACIDVEN in the last 168 hours.  Procedures/Operations  none   Alivya Wegman 11/14/2016, 3:00 PM
